# Patient Record
Sex: Male | Born: 1945
Health system: Southern US, Community
[De-identification: ages and names within clinical notes are randomized; demographics above are authoritative.]

## PROBLEM LIST (undated history)

## (undated) DIAGNOSIS — I10 Essential (primary) hypertension: Secondary | ICD-10-CM

## (undated) DIAGNOSIS — I480 Paroxysmal atrial fibrillation: Secondary | ICD-10-CM

## (undated) HISTORY — DX: Paroxysmal atrial fibrillation: I48.0

## (undated) HISTORY — DX: Essential (primary) hypertension: I10

---

## 1998-04-11 ENCOUNTER — Emergency Department (HOSPITAL_COMMUNITY): Admission: EM | Admit: 1998-04-11 | Discharge: 1998-04-11 | Payer: Self-pay | Admitting: Emergency Medicine

## 2004-08-02 ENCOUNTER — Ambulatory Visit (HOSPITAL_COMMUNITY): Admission: RE | Admit: 2004-08-02 | Discharge: 2004-08-02 | Payer: Self-pay | Admitting: Neurology

## 2004-09-11 ENCOUNTER — Ambulatory Visit: Payer: Self-pay | Admitting: *Deleted

## 2004-09-21 ENCOUNTER — Ambulatory Visit: Payer: Self-pay | Admitting: *Deleted

## 2004-09-21 ENCOUNTER — Ambulatory Visit: Payer: Self-pay

## 2004-09-21 ENCOUNTER — Ambulatory Visit: Payer: Self-pay | Admitting: Cardiology

## 2004-09-25 ENCOUNTER — Ambulatory Visit: Payer: Self-pay | Admitting: Cardiology

## 2004-10-02 ENCOUNTER — Ambulatory Visit: Payer: Self-pay | Admitting: Internal Medicine

## 2004-10-10 ENCOUNTER — Ambulatory Visit: Payer: Self-pay | Admitting: Cardiology

## 2004-10-10 ENCOUNTER — Ambulatory Visit: Payer: Self-pay | Admitting: *Deleted

## 2004-10-17 ENCOUNTER — Ambulatory Visit: Payer: Self-pay | Admitting: Cardiology

## 2004-10-24 ENCOUNTER — Ambulatory Visit: Payer: Self-pay | Admitting: Cardiology

## 2004-10-29 ENCOUNTER — Ambulatory Visit: Payer: Self-pay | Admitting: *Deleted

## 2004-11-01 ENCOUNTER — Ambulatory Visit: Payer: Self-pay | Admitting: Cardiology

## 2004-11-23 ENCOUNTER — Ambulatory Visit: Payer: Self-pay | Admitting: Cardiology

## 2004-11-23 ENCOUNTER — Ambulatory Visit: Payer: Self-pay | Admitting: Internal Medicine

## 2004-11-30 ENCOUNTER — Ambulatory Visit: Payer: Self-pay | Admitting: Cardiology

## 2004-12-14 ENCOUNTER — Ambulatory Visit: Payer: Self-pay | Admitting: Cardiology

## 2004-12-20 ENCOUNTER — Ambulatory Visit: Payer: Self-pay | Admitting: Internal Medicine

## 2004-12-31 ENCOUNTER — Ambulatory Visit: Payer: Self-pay | Admitting: Internal Medicine

## 2005-01-04 ENCOUNTER — Ambulatory Visit: Payer: Self-pay | Admitting: Internal Medicine

## 2005-01-22 ENCOUNTER — Ambulatory Visit: Payer: Self-pay | Admitting: *Deleted

## 2005-01-23 ENCOUNTER — Ambulatory Visit: Payer: Self-pay

## 2005-01-29 ENCOUNTER — Ambulatory Visit: Payer: Self-pay

## 2005-01-29 ENCOUNTER — Ambulatory Visit: Payer: Self-pay | Admitting: Internal Medicine

## 2005-02-21 ENCOUNTER — Ambulatory Visit: Payer: Self-pay | Admitting: Cardiology

## 2005-03-11 ENCOUNTER — Ambulatory Visit: Payer: Self-pay | Admitting: Internal Medicine

## 2005-03-22 ENCOUNTER — Ambulatory Visit: Payer: Self-pay | Admitting: Internal Medicine

## 2005-03-25 ENCOUNTER — Ambulatory Visit: Payer: Self-pay | Admitting: Internal Medicine

## 2005-03-25 ENCOUNTER — Ambulatory Visit: Payer: Self-pay | Admitting: Cardiology

## 2005-04-08 ENCOUNTER — Ambulatory Visit: Payer: Self-pay | Admitting: Cardiology

## 2005-04-17 ENCOUNTER — Ambulatory Visit: Payer: Self-pay | Admitting: Internal Medicine

## 2005-05-02 ENCOUNTER — Ambulatory Visit: Payer: Self-pay | Admitting: Cardiology

## 2005-05-23 ENCOUNTER — Ambulatory Visit: Payer: Self-pay | Admitting: Cardiology

## 2005-05-27 ENCOUNTER — Ambulatory Visit: Payer: Self-pay | Admitting: Internal Medicine

## 2005-06-03 ENCOUNTER — Ambulatory Visit: Payer: Self-pay | Admitting: Internal Medicine

## 2005-06-17 ENCOUNTER — Ambulatory Visit: Payer: Self-pay | Admitting: Internal Medicine

## 2005-07-03 ENCOUNTER — Ambulatory Visit: Payer: Self-pay | Admitting: Internal Medicine

## 2005-07-08 ENCOUNTER — Ambulatory Visit: Payer: Self-pay | Admitting: Internal Medicine

## 2005-08-05 ENCOUNTER — Ambulatory Visit: Payer: Self-pay | Admitting: Internal Medicine

## 2005-08-08 ENCOUNTER — Ambulatory Visit: Payer: Self-pay | Admitting: Internal Medicine

## 2005-09-02 ENCOUNTER — Ambulatory Visit: Payer: Self-pay | Admitting: Internal Medicine

## 2005-09-13 ENCOUNTER — Ambulatory Visit: Payer: Self-pay | Admitting: Internal Medicine

## 2005-09-23 ENCOUNTER — Ambulatory Visit: Payer: Self-pay | Admitting: Internal Medicine

## 2005-10-25 ENCOUNTER — Ambulatory Visit: Payer: Self-pay | Admitting: Internal Medicine

## 2005-11-07 ENCOUNTER — Ambulatory Visit: Payer: Self-pay | Admitting: Internal Medicine

## 2005-11-21 ENCOUNTER — Ambulatory Visit: Payer: Self-pay | Admitting: Cardiology

## 2005-11-29 ENCOUNTER — Ambulatory Visit: Payer: Self-pay | Admitting: Internal Medicine

## 2005-12-06 ENCOUNTER — Ambulatory Visit: Payer: Self-pay | Admitting: Cardiology

## 2006-01-03 ENCOUNTER — Ambulatory Visit: Payer: Self-pay | Admitting: Cardiovascular Disease

## 2006-01-14 ENCOUNTER — Ambulatory Visit: Payer: Self-pay | Admitting: Cardiovascular Disease

## 2006-01-31 ENCOUNTER — Ambulatory Visit: Payer: Self-pay | Admitting: Internal Medicine

## 2006-03-11 ENCOUNTER — Ambulatory Visit: Payer: Self-pay | Admitting: Cardiology

## 2006-03-25 ENCOUNTER — Ambulatory Visit: Payer: Self-pay | Admitting: *Deleted

## 2006-04-15 ENCOUNTER — Ambulatory Visit: Payer: Self-pay | Admitting: Cardiology

## 2006-05-09 ENCOUNTER — Ambulatory Visit: Payer: Self-pay | Admitting: Cardiology

## 2006-05-30 ENCOUNTER — Ambulatory Visit: Payer: Self-pay | Admitting: Cardiovascular Disease

## 2006-06-06 ENCOUNTER — Ambulatory Visit: Payer: Self-pay | Admitting: Internal Medicine

## 2006-06-11 ENCOUNTER — Ambulatory Visit: Payer: Self-pay | Admitting: Internal Medicine

## 2006-06-18 ENCOUNTER — Ambulatory Visit: Payer: Self-pay | Admitting: Internal Medicine

## 2006-06-20 ENCOUNTER — Encounter: Admission: RE | Admit: 2006-06-20 | Discharge: 2006-06-20 | Payer: Self-pay | Admitting: Internal Medicine

## 2006-06-20 ENCOUNTER — Ambulatory Visit: Payer: Self-pay | Admitting: *Deleted

## 2006-07-14 ENCOUNTER — Ambulatory Visit: Payer: Self-pay | Admitting: Cardiovascular Disease

## 2006-07-29 ENCOUNTER — Ambulatory Visit: Payer: Self-pay | Admitting: Cardiology

## 2006-08-08 ENCOUNTER — Ambulatory Visit: Payer: Self-pay | Admitting: Internal Medicine

## 2006-08-12 ENCOUNTER — Ambulatory Visit: Payer: Self-pay | Admitting: *Deleted

## 2006-08-26 ENCOUNTER — Ambulatory Visit: Payer: Self-pay | Admitting: *Deleted

## 2006-09-17 ENCOUNTER — Ambulatory Visit: Payer: Self-pay | Admitting: *Deleted

## 2006-09-17 ENCOUNTER — Ambulatory Visit: Payer: Self-pay | Admitting: Internal Medicine

## 2006-12-16 ENCOUNTER — Ambulatory Visit: Payer: Self-pay | Admitting: Cardiology

## 2006-12-30 ENCOUNTER — Ambulatory Visit: Payer: Self-pay | Admitting: Cardiology

## 2007-01-14 ENCOUNTER — Ambulatory Visit: Payer: Self-pay | Admitting: Internal Medicine

## 2007-03-06 ENCOUNTER — Ambulatory Visit: Payer: Self-pay | Admitting: Cardiovascular Disease

## 2007-03-25 ENCOUNTER — Ambulatory Visit: Payer: Self-pay | Admitting: Cardiology

## 2007-04-14 ENCOUNTER — Ambulatory Visit: Payer: Self-pay | Admitting: Cardiology

## 2007-05-21 ENCOUNTER — Ambulatory Visit: Payer: Self-pay | Admitting: Internal Medicine

## 2007-06-11 ENCOUNTER — Ambulatory Visit: Payer: Self-pay | Admitting: Cardiology

## 2007-06-23 ENCOUNTER — Ambulatory Visit: Payer: Self-pay | Admitting: Cardiovascular Disease

## 2007-07-14 ENCOUNTER — Ambulatory Visit: Payer: Self-pay | Admitting: Internal Medicine

## 2007-07-22 ENCOUNTER — Encounter: Admission: RE | Admit: 2007-07-22 | Discharge: 2007-07-22 | Payer: Self-pay | Admitting: Internal Medicine

## 2007-07-28 ENCOUNTER — Ambulatory Visit: Payer: Self-pay | Admitting: Cardiology

## 2007-08-06 ENCOUNTER — Ambulatory Visit: Payer: Self-pay | Admitting: Internal Medicine

## 2007-08-27 ENCOUNTER — Ambulatory Visit: Payer: Self-pay | Admitting: Cardiology

## 2007-08-27 LAB — CONVERTED CEMR LAB
Basophils Absolute: 0 10*3/uL (ref 0.0–0.1)
Eosinophils Absolute: 0.1 10*3/uL (ref 0.0–0.7)
HCT: 42.6 % (ref 39.0–52.0)
Hemoglobin: 14.4 g/dL (ref 13.0–17.0)
INR: 1.7 — ABNORMAL HIGH (ref 0.8–1.0)
Lymphocytes Relative: 22.3 % (ref 12.0–46.0)
Monocytes Absolute: 0.6 10*3/uL (ref 0.1–1.0)
Monocytes Relative: 8.5 % (ref 3.0–12.0)
Neutro Abs: 4.4 10*3/uL (ref 1.4–7.7)
Neutrophils Relative %: 66.9 % (ref 43.0–77.0)
RBC: 4.06 M/uL — ABNORMAL LOW (ref 4.22–5.81)
WBC: 6.5 10*3/uL (ref 4.5–10.5)

## 2007-09-16 ENCOUNTER — Ambulatory Visit: Payer: Self-pay | Admitting: Internal Medicine

## 2007-09-24 ENCOUNTER — Ambulatory Visit: Payer: Self-pay | Admitting: Internal Medicine

## 2007-12-18 ENCOUNTER — Ambulatory Visit: Payer: Self-pay | Admitting: Cardiology

## 2008-01-05 ENCOUNTER — Ambulatory Visit: Payer: Self-pay | Admitting: Cardiology

## 2008-01-11 ENCOUNTER — Ambulatory Visit: Payer: Self-pay | Admitting: Cardiology

## 2008-03-08 ENCOUNTER — Ambulatory Visit: Payer: Self-pay | Admitting: Cardiovascular Disease

## 2008-04-04 ENCOUNTER — Ambulatory Visit: Payer: Self-pay | Admitting: Internal Medicine

## 2008-04-20 ENCOUNTER — Ambulatory Visit: Payer: Self-pay | Admitting: Internal Medicine

## 2008-05-03 ENCOUNTER — Ambulatory Visit: Payer: Self-pay | Admitting: Internal Medicine

## 2008-05-20 ENCOUNTER — Ambulatory Visit: Payer: Self-pay | Admitting: Internal Medicine

## 2008-05-24 ENCOUNTER — Ambulatory Visit: Payer: Self-pay | Admitting: Cardiovascular Disease

## 2008-05-24 ENCOUNTER — Ambulatory Visit: Payer: Self-pay | Admitting: Internal Medicine

## 2008-05-27 IMAGING — CT CT CHEST W/ CM
2 of 3 series · 15 of 36 positions shown, 18 images · IV contrast (75CC OMNI 300)
Comparison: Abdomen and pelvis CT 06/10/06 from [HOSPITAL].

CLINICAL DATA: Right lower lobe pulmonary nodule by previous abdomen and pelvis CT. 
CT CHEST WITH CONTRAST:
TECHNIQUE: Multidetector CT imaging of the chest was performed following the standard protocol during bolus administration of intravenous contrast.
Contrast:  74cc Omnipaque 300.

[Series 2: routine chest · axial · 0.73mm/px · z∈[-287,+18]mm · 12 of 73 slices shown, 15 images]
[im 6/73  mediastinal]
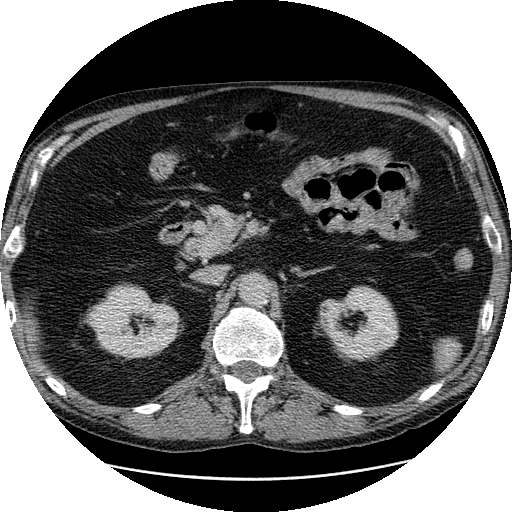
[im 6/73  lung]
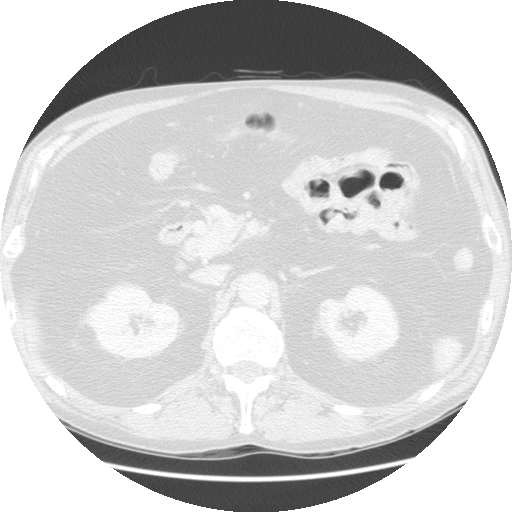
[im 11/73  lung]
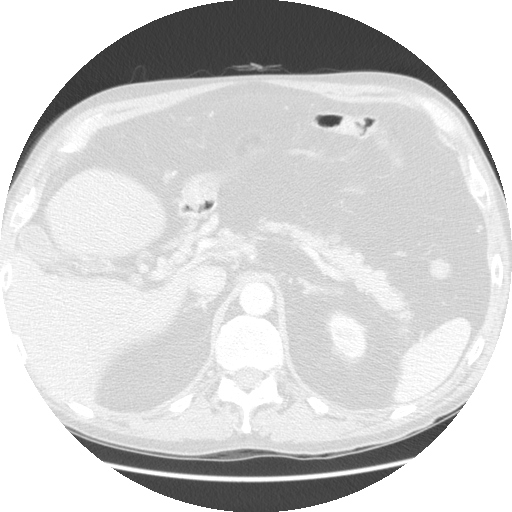
[im 17/73  lung]
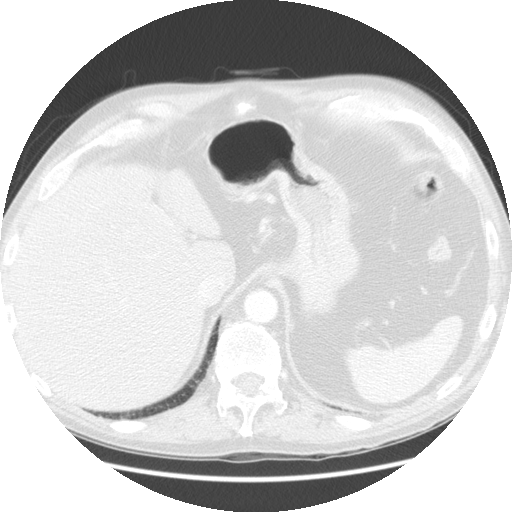
[im 22/73  lung]
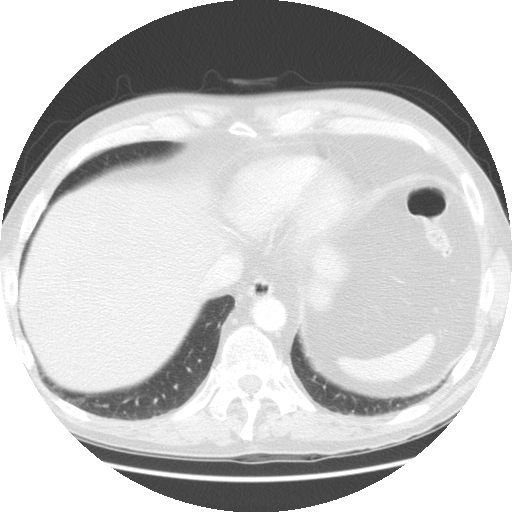
[im 27/73  mediastinal]
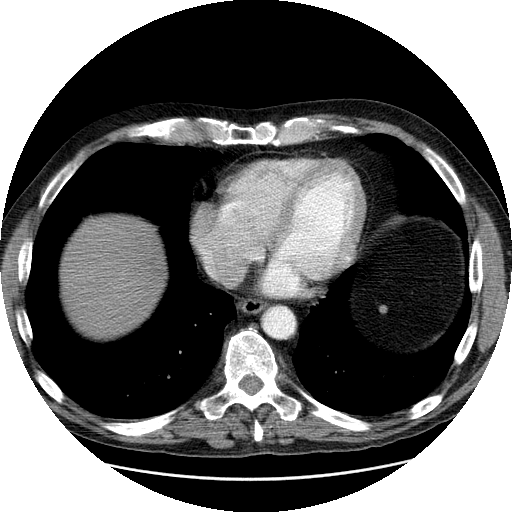
[im 27/73  lung]
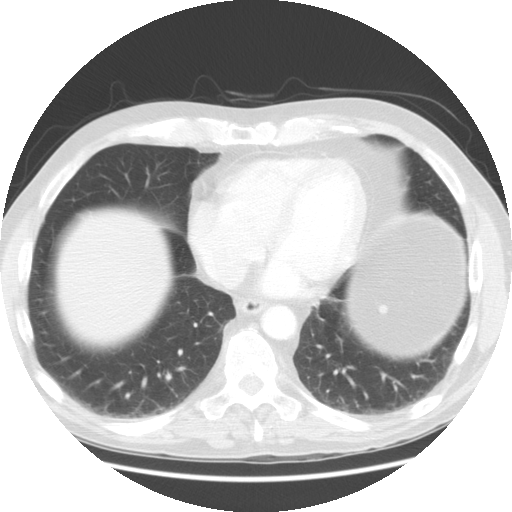
[im 33/73  lung]
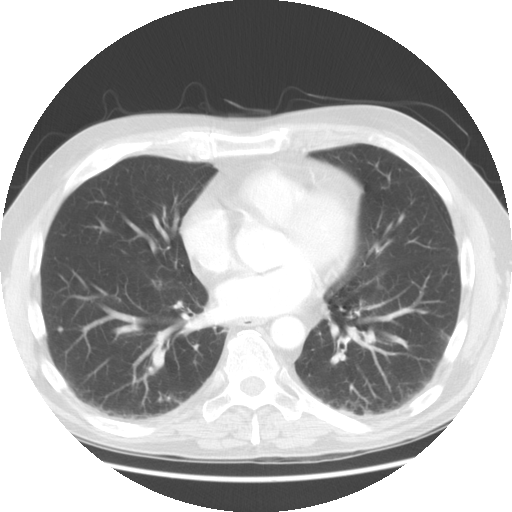
[im 41/73  lung]
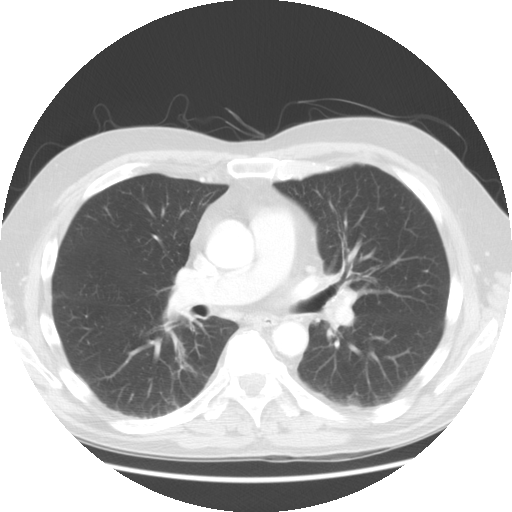
[im 46/73  lung]
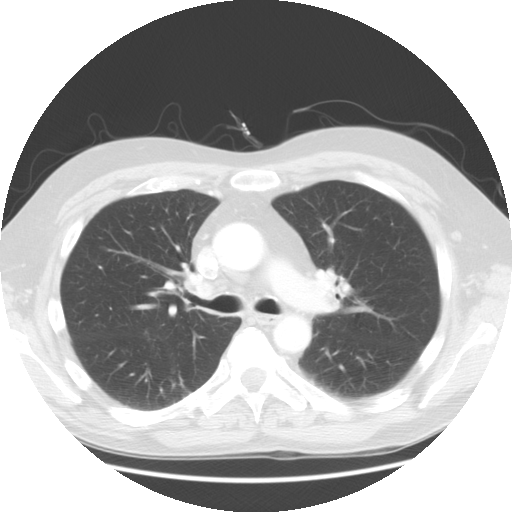
[im 51/73  mediastinal]
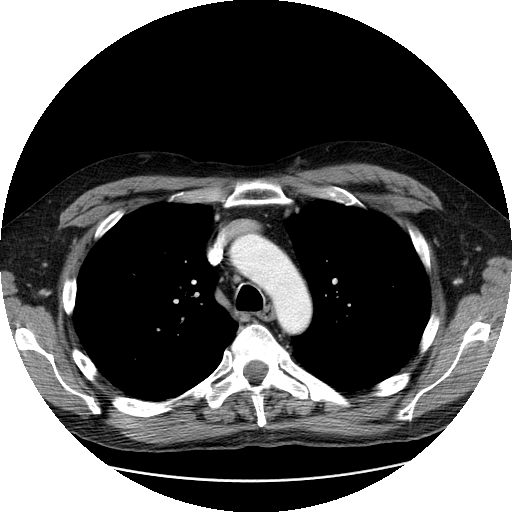
[im 51/73  lung]
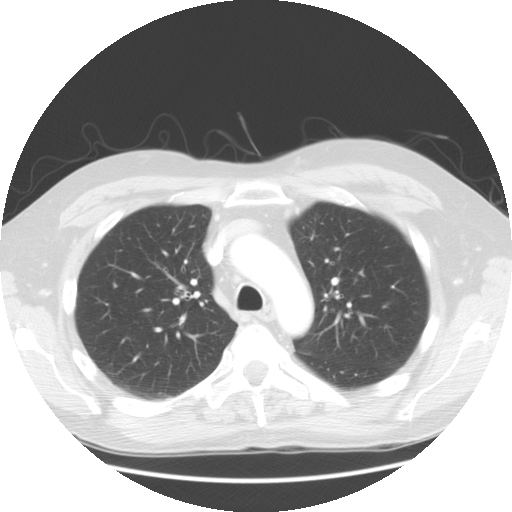
[im 57/73  lung]
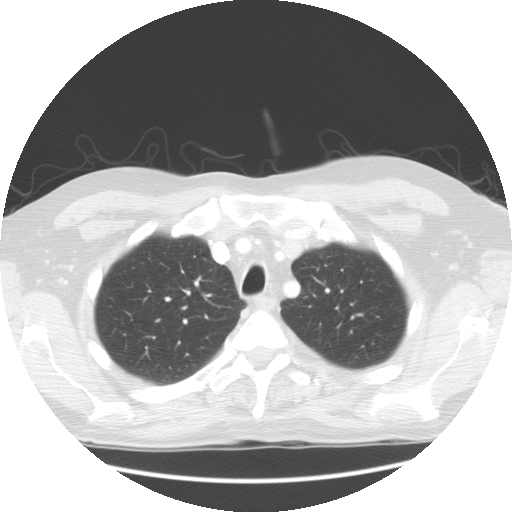
[im 62/73  lung]
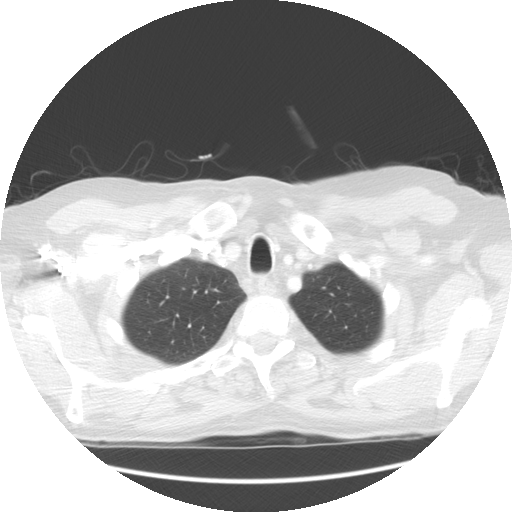
[im 67/73  lung]
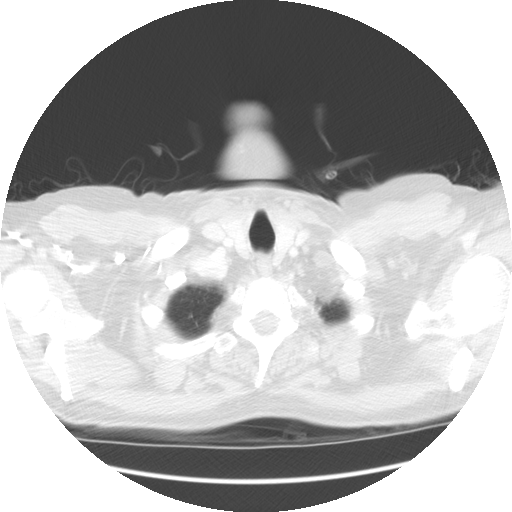

[Series 401: coronal · coronal · 0.73mm/px · 3 of 150 slices shown]
[im 30/150  lung]
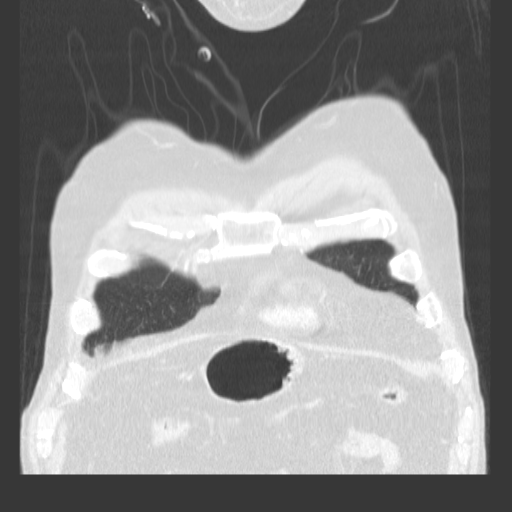
[im 60/150  lung]
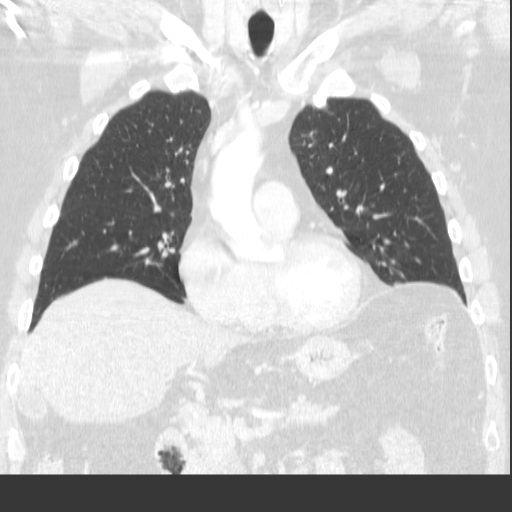
[im 90/150  lung]
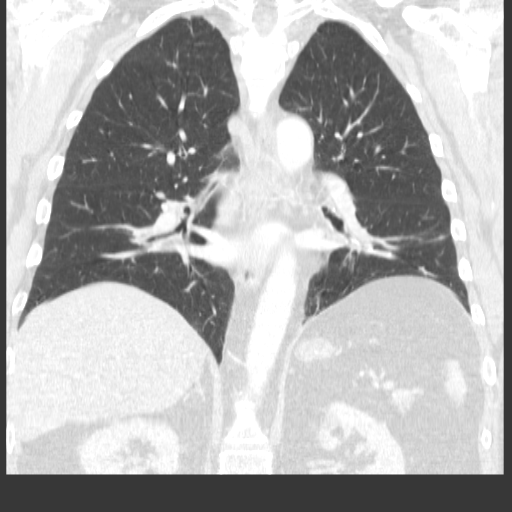

[15 of 36 positions shown; findings below may reference images not displayed]

FINDINGS: No axillary, mediastinal, or hilar adenopathy.  Normal heart size.  No pericardial or pleural effusion.  Intact thoracic aorta.  No aneurysm or dissection.  Mild atherosclerosis.  Central pulmonary arteries are patent.  Imaging of the upper abdomen demonstrates no acute finding. 
Lung windows redemonstrate a stable well circumscribed non calcified nodule in the right lower lobe measuring 4mm, image 41.  
No additional right lung nodules.  
On the left side, there is a second 4mm well circumscribed non calcified nodule in the superior segment left hepatic lobe (image 38).  No additional suspicious pulmonary nodule or mass.  Minimal subpleural scarring is evident in the left lung along the major fissure.  Atelectasis and scarring is noted in the lingula in both lower lobes.  No acute consolidation, pneumonia, interstitial change, or edema.  No pneumothorax.
IMPRESSION: 1.  Stable non calcified sub-cm bibasilar lung nodules.  Statistically, these are again most likely granulomas.  A follow up to document stability could be performed at six months. 
2.  No adenopathy or acute chest finding.

## 2008-06-03 ENCOUNTER — Ambulatory Visit: Payer: Self-pay | Admitting: Internal Medicine

## 2008-06-24 ENCOUNTER — Ambulatory Visit: Payer: Self-pay | Admitting: Internal Medicine

## 2008-07-15 ENCOUNTER — Ambulatory Visit: Payer: Self-pay | Admitting: Internal Medicine

## 2008-08-11 ENCOUNTER — Ambulatory Visit: Payer: Self-pay | Admitting: Cardiovascular Disease

## 2008-08-11 LAB — CONVERTED CEMR LAB: Prothrombin Time: 67.5 s (ref 10.9–13.3)

## 2008-08-16 ENCOUNTER — Ambulatory Visit: Payer: Self-pay | Admitting: Cardiovascular Disease

## 2008-08-30 ENCOUNTER — Ambulatory Visit: Payer: Self-pay | Admitting: Cardiology

## 2008-09-14 ENCOUNTER — Ambulatory Visit: Payer: Self-pay | Admitting: Cardiovascular Disease

## 2008-10-05 ENCOUNTER — Ambulatory Visit: Payer: Self-pay | Admitting: Internal Medicine

## 2008-10-11 ENCOUNTER — Encounter: Payer: Self-pay | Admitting: *Deleted

## 2008-11-16 ENCOUNTER — Encounter: Payer: Self-pay | Admitting: *Deleted

## 2008-12-27 ENCOUNTER — Ambulatory Visit: Payer: Self-pay | Admitting: Cardiovascular Disease

## 2009-04-13 ENCOUNTER — Encounter (INDEPENDENT_AMBULATORY_CARE_PROVIDER_SITE_OTHER): Payer: Self-pay | Admitting: Cardiology

## 2009-05-04 ENCOUNTER — Ambulatory Visit: Payer: Self-pay | Admitting: Cardiology

## 2009-05-04 LAB — CONVERTED CEMR LAB: POC INR: 1

## 2009-05-11 ENCOUNTER — Encounter (INDEPENDENT_AMBULATORY_CARE_PROVIDER_SITE_OTHER): Payer: Self-pay | Admitting: Cardiology

## 2009-05-11 ENCOUNTER — Ambulatory Visit: Payer: Self-pay | Admitting: Internal Medicine

## 2009-05-11 LAB — CONVERTED CEMR LAB: POC INR: 1.3

## 2009-05-17 ENCOUNTER — Ambulatory Visit: Payer: Self-pay | Admitting: Cardiovascular Disease

## 2009-05-31 ENCOUNTER — Ambulatory Visit: Payer: Self-pay | Admitting: Cardiovascular Disease

## 2009-05-31 LAB — CONVERTED CEMR LAB: POC INR: 2.9

## 2009-06-21 ENCOUNTER — Ambulatory Visit: Payer: Self-pay | Admitting: Cardiology

## 2009-06-21 LAB — CONVERTED CEMR LAB: POC INR: 3

## 2009-07-19 ENCOUNTER — Ambulatory Visit: Payer: Self-pay | Admitting: Cardiovascular Disease

## 2009-09-06 ENCOUNTER — Ambulatory Visit: Payer: Self-pay | Admitting: Internal Medicine

## 2009-09-27 ENCOUNTER — Ambulatory Visit: Payer: Self-pay | Admitting: Cardiology

## 2009-09-27 LAB — CONVERTED CEMR LAB: POC INR: 2.7

## 2009-10-12 DIAGNOSIS — I4891 Unspecified atrial fibrillation: Secondary | ICD-10-CM | POA: Insufficient documentation

## 2009-10-12 DIAGNOSIS — I10 Essential (primary) hypertension: Secondary | ICD-10-CM | POA: Insufficient documentation

## 2009-10-13 ENCOUNTER — Ambulatory Visit: Payer: Self-pay | Admitting: Internal Medicine

## 2009-10-13 ENCOUNTER — Ambulatory Visit: Payer: Self-pay | Admitting: Cardiovascular Disease

## 2009-10-13 LAB — CONVERTED CEMR LAB: POC INR: 3.2

## 2009-10-19 ENCOUNTER — Encounter: Payer: Self-pay | Admitting: Internal Medicine

## 2009-11-28 ENCOUNTER — Encounter: Payer: Self-pay | Admitting: Internal Medicine

## 2009-12-06 ENCOUNTER — Encounter: Payer: Self-pay | Admitting: Internal Medicine

## 2009-12-07 ENCOUNTER — Encounter: Payer: Self-pay | Admitting: Internal Medicine

## 2010-06-03 ENCOUNTER — Encounter: Payer: Self-pay | Admitting: Internal Medicine

## 2010-06-12 NOTE — Assessment & Plan Note (Signed)
Summary: per check out/sf  Medications Added FLECAINIDE ACETATE 150 MG TABS (FLECAINIDE ACETATE) Take one tablet by mouth twice daily. AMBIEN CR 12.5 MG CR-TABS (ZOLPIDEM TARTRATE) as needed PRADAXA 150 MG CAPS (DABIGATRAN ETEXILATE MESYLATE) one twice daily      Allergies Added: NKDA  Visit Type:  Follow-up Primary Provider:  Vassie Loll,  MD   History of Present Illness: Nathan Neal returns today for followup of his atrial fibrillation.  The patient is a pleasant 65 yo man with a longstanding h/o atrial fibrillation who has been controlled nicely on flecainide.  He notes that he has occaisional episodes of atrial fib but he only notes mild palpitations which was in contrast to fairly severe symptoms prior to medical therapy.  The patient denies syncope, c/p or sob.  No peripheral edema.  Current Medications (verified): 1)  Flecainide Acetate 150 Mg Tabs (Flecainide Acetate) .... Take One Tablet By Mouth Twice Daily. 2)  Metoprolol Succinate 100 Mg Xr24h-Tab (Metoprolol Succinate) .... Take One Tablet By Mouth Two Times A Day 3)  Altace 10 Mg Caps (Ramipril) .Marland Kitchen.. 1 Capsule By Mouth Daily 4)  Lipitor 10 Mg Tabs (Atorvastatin Calcium) .Marland Kitchen.. 1 Tablet By Mouth Daily 5)  Ambien Cr 12.5 Mg Cr-Tabs (Zolpidem Tartrate) .... As Needed 6)  Tramadol Hcl 50 Mg Tabs (Tramadol Hcl) .Marland Kitchen.. 1 Tablet By Mouth Daily As Needed For Pain 7)  Coumadin 5 Mg Tabs (Warfarin Sodium) .... Take As Directed By [facility]madin Clinic  Allergies (verified): No Known Drug Allergies  Past History:  Past Medical History: Last updated: 10/12/2009 Current Problems:  HYPERTENSION (ICD-401.9) COUMADIN THERAPY (ICD-V58.61) PAROXYSMAL ATRIAL FIBRILLATION (ICD-427.31)    Review of Systems  The patient denies chest pain, syncope, dyspnea on exertion, and peripheral edema.    Vital Signs:  Patient profile:   65 year old male Height:      75 inches Weight:      206 pounds BMI:     25.84 Pulse rate:   73 / minute BP sitting:    104 / 62  (left arm)  Vitals Entered By: Laurance Flatten CMA (October 13, 2009 3:52 PM)  Physical Exam  General:  Well developed, well nourished, in no acute distress.  HEENT: normal except wears glasses. Neck: supple. No JVD. Carotids 2+ bilaterally no bruits Cor: RRR no rubs, gallops or murmur Lungs: CTA with no wheezes, rales, or rhonchi Ab: soft, nontender. nondistended. No HSM. Good bowel sounds Ext: warm. no cyanosis, clubbing or edema Neuro: alert and oriented. Grossly nonfocal. affect pleasant    EKG  Procedure date:  10/13/2009  Findings:      Normal sinus rhythm with rate of:  First degree AV-Block noted.    Impression & Recommendations:  Problem # 1:  PAROXYSMAL ATRIAL FIBRILLATION (ICD-427.31) His symptoms appear to be well controlled.  I have asked him to obtain a trough flecainide level.  No other medicine changes. The following medications were removed from the medication list:    Coumadin 1 Mg Tabs (Warfarin sodium) .Marland Kitchen... Take as directed by coumadin clinic. His updated medication list for this problem includes:    Flecainide Acetate 150 Mg Tabs (Flecainide acetate) .Marland Kitchen... Take one tablet by mouth twice daily.    Metoprolol Succinate 100 Mg Xr24h-tab (Metoprolol succinate) .Marland Kitchen... Take one tablet by mouth two times a day    Coumadin 5 Mg Tabs (Warfarin sodium) .Marland Kitchen... Take as directed by coumadin clinic  Problem # 2:  HYPERTENSION (ICD-401.9) His blood pressure remains well controlled. A  low sodium diet is recommended. His updated medication list for this problem includes:    Metoprolol Succinate 100 Mg Xr24h-tab (Metoprolol succinate) .Marland Kitchen... Take one tablet by mouth two times a day    Altace 10 Mg Caps (Ramipril) .Marland Kitchen... 1 capsule by mouth daily  Problem # 3:  COUMADIN THERAPY (ICD-V58.61) We discussed the treatment options with regard to continuing coumadin vs switching to Pradaxa.  After careful discussion, he would like to try Pradaxa.  Patient Instructions: 1)   Your physician recommends that you schedule a follow-up appointment in: 6 months with Dr Ladona Ridgel 2)  Your physician has recommended you make the following change in your medication: Stop coumadin and start Pradaxa 150 mg twice a day. 3)  Your physician recommends that you return for lab work 10 days after starting Pradaxa (CBC) Prescriptions: PRADAXA 150 MG CAPS (DABIGATRAN ETEXILATE MESYLATE) one twice daily  #60 x 6   Entered by:   Dennis Bast, RN, BSN   Authorized by:   Laren Boom, MD, [facility]   Signed by:   Dennis Bast, RN, BSN on 10/13/2009   Method used:   Electronically to        [facility] (retail)       [address]       [place], [place]  [id number]       Ph: [phone] or [phone]       Fax: [phone]   RxID:   [id number]

## 2010-06-12 NOTE — Medication Information (Signed)
Summary: [facility]  Anticoagulant Therapy  Managed by: Inactive Referring MD: Lewayne Bunting PCP: Vassie Loll,  MD Supervising MD: Graciela Husbands MD, Viviann Spare Indication 1: Atrial Fibrillation (ICD-427.31) Lab Used: [facility] Site: [facility] INR RANGE 2 - 3          Comments: Pt is off Coumadin and on Pradaxa. Cloyde Reams RN  November 28, 2009 10:58 AM   Allergies: No Known Drug Allergies  Anticoagulation Management History:      Negative risk factors for bleeding include an age less than 41 years old.  The bleeding index is 'low risk'.  Positive CHADS2 values include History of HTN.  Negative CHADS2 values include Age > 50 years old.  The start date was 09/21/2004.  His last INR was 7.0 ratio.  Anticoagulation responsible provider: Graciela Husbands MD, Viviann Spare.  Exp: 12/2010.    Anticoagulation Management Assessment/Plan:      The patient's current anticoagulation dose is Coumadin 5 mg tabs: Take as directed by [facility] Clinic.  The target INR is 2 - 3.  The next INR is due 11/15/2009.  Anticoagulation instructions were given to patient.  Results were reviewed/authorized by Inactive.         Prior Anticoagulation Instructions: INR 3.2 Skip today then resume 2.5mg s daily. Recheck in 4 weeks.

## 2010-06-12 NOTE — Medication Information (Signed)
Summary: rov/ewj  Anticoagulant Therapy  Managed by: Weston Brass, PharmD Referring MD: Lewayne Bunting Supervising MD: Shirlee Latch MD, Freida Busman Indication 1: Atrial Fibrillation (ICD-427.31) Lab Used: [facility] Site: [facility] INR POC 2.7 INR RANGE 2 - 3  Dietary changes: no    Health status changes: no    Bleeding/hemorrhagic complications: no    Recent/future hospitalizations: no    Any changes in medication regimen? no    Recent/future dental: no  Any missed doses?: no       Is patient compliant with meds? yes       Allergies: No Known Drug Allergies  Anticoagulation Management History:      The patient is taking warfarin and comes in today for a routine follow up visit.  Negative risk factors for bleeding include an age less than 45 years old.  The bleeding index is 'low risk'.  Negative CHADS2 values include Age > 25 years old.  The start date was 09/21/2004.  His last INR was 7.0 ratio.  Anticoagulation responsible provider: Shirlee Latch MD, Bekim Werntz.  INR POC: 2.7.  Cuvette Lot#: [id number].  Exp: 12/2010.    Anticoagulation Management Assessment/Plan:      The patient's current anticoagulation dose is Coumadin 5 mg tabs: Take as directed by [facility] Clinic, Coumadin 1 mg tabs: Take as directed by [facility]madin Clinic..  The target INR is 2 - 3.  The next INR is due 10/13/2009.  Anticoagulation instructions were given to patient.  Results were reviewed/authorized by Weston Brass, PharmD.  He was notified by Weston Brass PharmD.         Prior Anticoagulation Instructions: INR 4.0  Skip today's dosage of coumadin, then 1mg  tomorrow then resume same dosage 2.5mg  daily.  Recheck in 3 weeks.    Current Anticoagulation Instructions: INR 2.7  Continue same dose of 1/2 tablet every day

## 2010-06-12 NOTE — Letter (Signed)
Summary: [facility] Annual Physical Exam Notes   [facility] Annual Physical Exam Notes   Imported By: Roderic Ovens 12/22/2009 14:45:29  _____________________________________________________________________  External Attachment:    Type:   Image     Comment:   External Document

## 2010-06-12 NOTE — Medication Information (Signed)
Summary: rov.mp  Anticoagulant Therapy  Managed by: Cloyde Reams, RN, BSN Referring MD: Lewayne Bunting Supervising MD: Clifton James MD, Cristal Deer Indication 1: Atrial Fibrillation (ICD-427.31) Lab Used: [facility] Site: [facility] INR POC 2.6 INR RANGE 2 - 3  Dietary changes: no    Health status changes: no    Bleeding/hemorrhagic complications: no    Recent/future hospitalizations: no    Any changes in medication regimen? no    Recent/future dental: no  Any missed doses?: no       Is patient compliant with meds? yes       Allergies (verified): No Known Drug Allergies  Anticoagulation Management History:      The patient is taking warfarin and comes in today for a routine follow up visit.  Negative risk factors for bleeding include an age less than 17 years old.  The bleeding index is 'low risk'.  Negative CHADS2 values include Age > 105 years old.  The start date was 09/21/2004.  His last INR was 7.0 ratio.  Anticoagulation responsible provider: Clifton James MD, Cristal Deer.  INR POC: 2.6.  Cuvette Lot#: [id number].  Exp: 06/2010.    Anticoagulation Management Assessment/Plan:      The patient's current anticoagulation dose is Coumadin 5 mg tabs: Take as directed by [facility] Clinic, Coumadin 1 mg tabs: Take as directed by [facility] Clinic..  The target INR is 2 - 3.  The next INR is due 05/31/2009.  Anticoagulation instructions were given to patient.  Results were reviewed/authorized by Cloyde Reams, RN, BSN.  He was notified by Cloyde Reams RN.         Prior Anticoagulation Instructions: INR 1.3 Take 5 mg = 1 tab daily for 3 days, then resume 2.5 mg = 0.5 tab daily.  Recheck in 1 week.   Current Anticoagulation Instructions: INR 2.6  Continue on same dosage 2.5mg  daily.  Recheck in 2 weeks.

## 2010-06-12 NOTE — Medication Information (Signed)
Summary: ccr  Anticoagulant Therapy  Managed by: Cloyde Reams, RN, BSN Referring MD: Lewayne Bunting Supervising MD: Gala Romney MD, Reuel Boom Indication 1: Atrial Fibrillation (ICD-427.31) Lab Used: [facility] Site: [facility] INR POC 4.1 INR RANGE 2 - 3  Dietary changes: no    Health status changes: no    Bleeding/hemorrhagic complications: no    Recent/future hospitalizations: no    Any changes in medication regimen? no    Recent/future dental: no  Any missed doses?: no       Is patient compliant with meds? yes       Allergies (verified): No Known Drug Allergies  Anticoagulation Management History:      The patient is taking warfarin and comes in today for a routine follow up visit.  Negative risk factors for bleeding include an age less than 24 years old.  The bleeding index is 'low risk'.  Negative CHADS2 values include Age > 23 years old.  The start date was 09/21/2004.  His last INR was 7.0 ratio.  Anticoagulation responsible provider: Bensimhon MD, Reuel Boom.  INR POC: 4.1.  Cuvette Lot#: [id number].  Exp: 10/2010.    Anticoagulation Management Assessment/Plan:      The patient's current anticoagulation dose is Coumadin 5 mg tabs: Take as directed by [facility]in Clinic, Coumadin 1 mg tabs: Take as directed by [facility]madin Clinic..  The target INR is 2 - 3.  The next INR is due 09/27/2009.  Anticoagulation instructions were given to patient.  Results were reviewed/authorized by Cloyde Reams, RN, BSN.  He was notified by Cloyde Reams RN.         Prior Anticoagulation Instructions: INR 3.0 Continue 2.5mg s daily. Recheck in 4 weeks.   Current Anticoagulation Instructions: INR 4.0  Skip today's dosage of coumadin, then 1mg  tomorrow then resume same dosage 2.5mg  daily.  Recheck in 3 weeks.

## 2010-06-12 NOTE — Medication Information (Signed)
Summary: Nathan Neal  Anticoagulant Therapy  Managed by: Bethena Midget, RN, BSN Referring MD: Lewayne Bunting Supervising MD: Eden Emms MD, Theron Arista Indication 1: Atrial Fibrillation (ICD-427.31) Lab Used: [facility] Site: [facility] INR POC 3.0 INR RANGE 2 - 3  Dietary changes: no    Health status changes: no    Bleeding/hemorrhagic complications: no    Recent/future hospitalizations: no    Any changes in medication regimen? no    Recent/future dental: no  Any missed doses?: no       Is patient compliant with meds? yes       Allergies: No Known Drug Allergies  Anticoagulation Management History:      The patient is taking warfarin and comes in today for a routine follow up visit.  Negative risk factors for bleeding include an age less than 1 years old.  The bleeding index is 'low risk'.  Negative CHADS2 values include Age > 35 years old.  The start date was 09/21/2004.  His last INR was 7.0 ratio.  Anticoagulation responsible provider: Eden Emms MD, Theron Arista.  INR POC: 3.0.  Cuvette Lot#: [id number].  Exp: 09/2010.    Anticoagulation Management Assessment/Plan:      The patient's current anticoagulation dose is Coumadin 5 mg tabs: Take as directed by [facility]in Clinic, Coumadin 1 mg tabs: Take as directed by [facility]in Clinic..  The target INR is 2 - 3.  The next INR is due 08/16/2009.  Anticoagulation instructions were given to patient.  Results were reviewed/authorized by Bethena Midget, RN, BSN.  He was notified by Bethena Midget, RN, BSN.         Prior Anticoagulation Instructions: INR 3.0  Continue on same dosage 1/2 tablet daily.  Recheck in 4 weeks.    Current Anticoagulation Instructions: INR 3.0 Continue 2.5mg s daily. Recheck in 4 weeks.

## 2010-06-12 NOTE — Medication Information (Signed)
Summary: rov/ewj  Anticoagulant Therapy  Managed by: Shelby Dubin, PharmD Referring MD: Lewayne Bunting Supervising MD: Eden Emms MD, Theron Arista Indication 1: Atrial Fibrillation (ICD-427.31) Lab Used: [facility] Site: [facility] INR POC 2.9 INR RANGE 2 - 3  Dietary changes: no    Health status changes: no    Bleeding/hemorrhagic complications: no    Recent/future hospitalizations: no    Any changes in medication regimen? no    Recent/future dental: no  Any missed doses?: no       Is patient compliant with meds? yes       Allergies: No Known Drug Allergies  Anticoagulation Management History:      The patient is taking warfarin and comes in today for a routine follow up visit.  Negative risk factors for bleeding include an age less than 50 years old.  The bleeding index is 'low risk'.  Negative CHADS2 values include Age > 58 years old.  The start date was 09/21/2004.  His last INR was 7.0 ratio.  Anticoagulation responsible provider: Eden Emms MD, Theron Arista.  INR POC: 2.9.  Cuvette Lot#: [id number].  Exp: 08/2010.    Anticoagulation Management Assessment/Plan:      The patient's current anticoagulation dose is Coumadin 5 mg tabs: Take as directed by [facility]madin Clinic, Coumadin 1 mg tabs: Take as directed by [facility] Clinic..  The target INR is 2 - 3.  The next INR is due 06/21/2009.  Anticoagulation instructions were given to patient.  Results were reviewed/authorized by Shelby Dubin, PharmD.  He was notified by Lew Dawes, PharmD Candidate.         Prior Anticoagulation Instructions: INR 2.6  Continue on same dosage 2.5mg  daily.  Recheck in 2 weeks.    Current Anticoagulation Instructions: INR 2.9  Continue same dose of 0.5 tablet daily. Recheck in 3 weeks.

## 2010-06-12 NOTE — Medication Information (Signed)
Summary: rov/eh  Anticoagulant Therapy  Managed by: Cloyde Reams, RN, BSN Referring MD: Lewayne Bunting Supervising MD: Jens Som MD, Arlys John Indication 1: Atrial Fibrillation (ICD-427.31) Lab Used: [facility] Site: [facility] INR POC 3.0 INR RANGE 2 - 3  Dietary changes: no    Health status changes: no    Bleeding/hemorrhagic complications: no    Recent/future hospitalizations: no    Any changes in medication regimen? no    Recent/future dental: no  Any missed doses?: no       Is patient compliant with meds? yes       Allergies (verified): No Known Drug Allergies  Anticoagulation Management History:      The patient is taking warfarin and comes in today for a routine follow up visit.  Negative risk factors for bleeding include an age less than 3 years old.  The bleeding index is 'low risk'.  Negative CHADS2 values include Age > 37 years old.  The start date was 09/21/2004.  His last INR was 7.0 ratio.  Anticoagulation responsible provider: Jens Som MD, Arlys John.  INR POC: 3.0.  Cuvette Lot#: [id number].  Exp: 08/2010.    Anticoagulation Management Assessment/Plan:      The patient's current anticoagulation dose is Coumadin 5 mg tabs: Take as directed by [facility]madin Clinic, Coumadin 1 mg tabs: Take as directed by [facility]madin Clinic..  The target INR is 2 - 3.  The next INR is due 07/19/2009.  Anticoagulation instructions were given to patient.  Results were reviewed/authorized by Cloyde Reams, RN, BSN.  He was notified by Cloyde Reams RN.         Prior Anticoagulation Instructions: INR 2.9  Continue same dose of 0.5 tablet daily. Recheck in 3 weeks.     Current Anticoagulation Instructions: INR 3.0  Continue on same dosage 1/2 tablet daily.  Recheck in 4 weeks.

## 2010-06-12 NOTE — Medication Information (Signed)
Summary: rov/sp  Anticoagulant Therapy  Managed by: Bethena Midget, RN, BSN Referring MD: Lewayne Bunting Supervising MD: Excell Seltzer MD, Casimiro Needle Indication 1: Atrial Fibrillation (ICD-427.31) Lab Used: [facility] Site: [facility] INR POC 3.2 INR RANGE 2 - 3  Dietary changes: no    Health status changes: no    Bleeding/hemorrhagic complications: no    Recent/future hospitalizations: no    Any changes in medication regimen? no    Recent/future dental: no  Any missed doses?: no       Is patient compliant with meds? yes      Comments: Seeing Dr Ladona Ridgel today.   Allergies: No Known Drug Allergies  Anticoagulation Management History:      The patient is taking warfarin and comes in today for a routine follow up visit.  Negative risk factors for bleeding include an age less than 51 years old.  The bleeding index is 'low risk'.  Positive CHADS2 values include History of HTN.  Negative CHADS2 values include Age > 54 years old.  The start date was 09/21/2004.  His last INR was 7.0 ratio.  Anticoagulation responsible provider: Excell Seltzer MD, Casimiro Needle.  INR POC: 3.2.  Cuvette Lot#: [id number].  Exp: 12/2010.    Anticoagulation Management Assessment/Plan:      The patient's current anticoagulation dose is Coumadin 5 mg tabs: Take as directed by [facility] Clinic, Coumadin 1 mg tabs: Take as directed by [facility] Clinic..  The target INR is 2 - 3.  The next INR is due 11/15/2009.  Anticoagulation instructions were given to patient.  Results were reviewed/authorized by Bethena Midget, RN, BSN.  He was notified by Bethena Midget, RN, BSN.         Prior Anticoagulation Instructions: INR 2.7  Continue same dose of 1/2 tablet every day  Current Anticoagulation Instructions: INR 3.2 Skip today then resume 2.5mg s daily. Recheck in 4 weeks.

## 2010-08-07 ENCOUNTER — Other Ambulatory Visit: Payer: Self-pay | Admitting: *Deleted

## 2010-08-07 DIAGNOSIS — I4891 Unspecified atrial fibrillation: Secondary | ICD-10-CM

## 2010-08-07 MED ORDER — METOPROLOL SUCCINATE ER 100 MG PO TB24: 100.0000 mg | ORAL_TABLET | Freq: Two times a day (BID) | ORAL | Status: DC

## 2010-09-25 NOTE — Assessment & Plan Note (Signed)
[facility]                         ELECTROPHYSIOLOGY OFFICE NOTE   Nathan Neal, RIVIELLO                   MRN:          [id number]  DATE:05/20/2008                            DOB:          05/04/1946    Nathan Neal returns today for followup.  He is a 65 year old male with  a history of paroxysmal atrial fibrillation and hypertension, who has  been very nicely controlled on flecainide for the last several years.  He has ultimately required 150 mg twice daily, but with this he has had  a very few symptoms of palpitations or other symptoms.  He did have some  severe fatigue, which at least partly related to a low testosterone  levels.  He is status post orchiectomy in the past secondary to a  testicular cancer.  He is improved from the energy level perspective on  testosterone injections.  No other specific complaints were made today.   MEDICATIONS:  1. Lipitor 10 mg a day.  2. Coumadin as directed.  3. Flecainide 150 twice.  4. Toprol-XL 100 mg twice daily.  5. Lunesta 3 mg nightly p.r.n.  6. Ramipril 10 mg daily.   PHYSICAL EXAMINATION:  GENERAL:  He is a pleasant, well-appearing,  middle-aged man in no distress.  VITAL SIGNS:  Blood pressure today was 130/78, the pulse 56 and regular,  respirations are 18, and the weight is 219 pounds. NECK:  No jugular  venous distention.  LUNGS:  Clear bilaterally to auscultation.  No wheezes, rales, or  rhonchi are present.  No increased work of breathing.  CARDIOVASCULAR:  Regular rate and rhythm.  Normal S1 and S2.  No  murmurs, rubs, or gallops.  ABDOMEN:  Soft and nontender.  EXTREMITIES:  No edema.   EKG demonstrates sinus bradycardia with first-degree AV block.  QRS  duration was 98 msec.   IMPRESSION:  1. Paroxysmal atrial fibrillation.  2. Chronic Coumadin therapy.  3. Hypertension.  4. Chronic flecainide therapy with nice control of his atrial      fibrillation.   DISCUSSION:  Mr.  Neal is stable.  His AFib is well controlled.  I  have asked that he obtain a trough flecainide level when he comes back  next week and to have his Coumadin levels  checked.  We will plan for now to continue his dose of flecainide as  this though we want to make  sure his flecainide level was not too high for this.  We will see the  patient back in 9 or 10 months or sooner if his AFib becomes more  problematic.     Doylene Canning. Ladona Ridgel, MD  Electronically Signed    GWT/MedQ  DD: 05/20/2008  DT: 05/21/2008  Job #: [id number]

## 2010-09-25 NOTE — Assessment & Plan Note (Signed)
[facility]                         ELECTROPHYSIOLOGY OFFICE NOTE   LEARY, MCNULTY                   MRN:          [id number]  DATE:09/16/2007                            DOB:          1945-06-07    [name] returns today for follow-up.  He is a very pleasant 65-year-  old male with a history of paroxysmal atrial fibrillation and  hypertension who returns today for follow-up.  He notes that his atrial  fibrillation has been fairly well-controlled.  He complains of fatigue  and weakness, particularly with exertion.  He states his exercise  capacity has diminished over the last couple of years.  Unfortunately,  he is no longer exercising on a regular basis.  He does walk, however,  some.   His current medications include:  1. Lipitor 10 a day.  2. Coumadin as directed.  3. Flecainide 150 twice a day.  4. Toprol 100 mg twice daily.  5. Lunesta 3 mg every night.  6. Tramadol 1 tablet b.i.d.  7. Ramipril daily; do not have the dose.   On physical exam, he is a pleasant well-appearing middle-aged man in no  distress.  Blood pressure was 111/64, the pulse 60 and regular,  respirations were 18.  Weight was 215 pounds.  NECK:  Revealed no jugular venous distention.  LUNGS:  Clear bilaterally to auscultation.  No wheezes, rales or rhonchi  are present.  CARDIOVASCULAR EXAM:  Revealed a regular rate and rhythm.  Normal S1 and  S2.  EXTREMITIES:  Demonstrated no edema.   His EKG demonstrates sinus rhythm with first-degree AV block.   IMPRESSION:  1. Paroxysmal atrial fibrillation.  2. Symptomatic bradycardia.  3. Fatigue and weakness.   DISCUSSION:  Mr. Carswell is stable.  I am concerned his beta blockers  are increasing his fatigue and weakness, perhaps along with his  flecainide.  However, he is maintaining sinus rhythm very nicely, and  for this reason, I have recommend that we continue both medications.  At  some point, we may  consider trying to decrease his beta blockers to see  if his energy level will improve.  He will Coumadin for now, and we will  see him back in the office in approximately six months.    Doylene Canning. Ladona Ridgel, MD  Electronically Signed   GWT/MedQ  DD: 09/16/2007  DT: 09/16/2007  Job #: [id number]

## 2010-09-28 NOTE — Assessment & Plan Note (Signed)
[facility]                         ELECTROPHYSIOLOGY OFFICE NOTE   SILVIANO, NEUSER                   MRN:          [id number]  DATE:06/11/2006                            DOB:          1945-12-31    SUBJECTIVE:  Mr. Coleson returns today for a follow-up visit.  He is a  very pleasant middle-aged male with a history of atrial fibrillation and  hypertension, who has been on chronic Flecainide therapy over the last  year.  He returns today for followup.  He has had very little in the way  of symptomatic palpitations since we saw him last in the office.  His  main complaint today is that he had episodic periods where he feels  dizzy and lightheaded, though he has not had frank syncope.  The patient  notes that he feels like his heart skips a beat occasionally.  He denies  chest pain.  He denies shortness of breath.  With strenuous activity he  does occasionally note episodes where he feels very weak and is unable  to function at an optimal level.   PHYSICAL EXAMINATION:  GENERAL:  He is a pleasant middle-aged man, who  is in no acute distress.  VITAL SIGNS:  Blood pressure 116/70, pulse 67 and regular, respirations  18, weight 217 pounds.  NECK:  No jugular venous distention.  LUNGS:  Clear bilaterally to auscultation.  No wheezes, rales or  rhonchi.  CARDIOVASCULAR:  A regular rate and rhythm with normal S1 and S2.  EXTREMITIES:  No clubbing, cyanosis or edema.  The pulses were 2+ and  symmetric.   MEDICATIONS:  1. Lotrel 10/20 mg daily.  2. Lipitor.  3. Zyrtec.  4. Coumadin.  5. Flecainide 150 mg b.i.d.  6. Toprol 100 mg b.i.d.  7. He also takes Lunesta to help sleep.   IMPRESSION:  1. Paroxysmal atrial fibrillation.  2. Chronic Coumadin therapy.  3. Hypertension.   DISCUSSION:  Overall Mr. Advani is stable but his symptoms of  intermittent fatigue and weakness, particularly with exertion and  occasional dizzy episodes are  somewhat concerning.  We talked about the possibility that watchful waiting or whether or not  we should have him wear  a 30-day monitor.  Will plan a period of watchful waiting and see the  patient back in a couple of months.  Should he have frank syncope or  other symptoms, he is instructed to call the office.     Doylene Canning. Ladona Ridgel, MD  Electronically Signed    GWT/MedQ  DD: 06/11/2006  DT: 06/11/2006  Job #: [id number]

## 2010-09-28 NOTE — Assessment & Plan Note (Signed)
[facility]                         ELECTROPHYSIOLOGY OFFICE NOTE   LATHON, ADAN                   MRN:          [id number]  DATE:08/08/2006                            DOB:          Oct 21, 1945    Nathan Neal returns today for followup.  He is a very pleasant middle-  aged man with a history of persistent atrial fibrillation who has been  maintained in sinus rhythm very nicely on a combination of beta-blockers  and flecainide.  He returns today for followup, he continues on  Coumadin.  He has had very little, if any, palpitation since I last saw  him in January.  He is back to his usual state of health and otherwise  feeling well.   EXAM:  He is a pleasant, well-appearing, middle-aged man in no distress.  The blood pressure was 114/72, the pulse was 67 and regular, the  respirations were 18, the weight was 214 pounds.  NECK:  No jugular venous distention.  LUNGS:  Clear bilaterally to auscultation.  No wheezes, rales or  rhonchi.  CARDIOVASCULAR:  A regular rate and rhythm with a normal S1 and S2.  EXTREMITIES:  Demonstrated no edema.   IMPRESSION:  1. Paroxysmal atrial fibrillation.  2. Chronic Coumadin therapy.  3. Hypertension.   DISCUSSION:  Overall, Nathan Neal is stable on his combination of  Toprol and flecainide.  Will plan to see him back in the office in 1  year, sooner if he has recurrent symptomatic palpitations and problems  with his afib.     Doylene Canning. Ladona Ridgel, MD  Electronically Signed    GWT/MedQ  DD: 08/08/2006  DT: 08/08/2006  Job #: [id number]   cc:   Larina Earthly, M.D.

## 2010-10-05 ENCOUNTER — Other Ambulatory Visit: Payer: Self-pay | Admitting: *Deleted

## 2010-10-05 MED ORDER — DABIGATRAN ETEXILATE MESYLATE 150 MG PO CAPS: 150.0000 mg | ORAL_CAPSULE | Freq: Two times a day (BID) | ORAL | Status: DC

## 2010-10-08 ENCOUNTER — Other Ambulatory Visit: Payer: Self-pay | Admitting: *Deleted

## 2010-10-08 MED ORDER — DABIGATRAN ETEXILATE MESYLATE 150 MG PO CAPS: 150.0000 mg | ORAL_CAPSULE | Freq: Two times a day (BID) | ORAL | Status: DC

## 2010-12-17 ENCOUNTER — Other Ambulatory Visit: Payer: Self-pay | Admitting: Internal Medicine

## 2011-01-22 ENCOUNTER — Other Ambulatory Visit: Payer: Self-pay | Admitting: Internal Medicine

## 2011-04-11 ENCOUNTER — Encounter: Payer: Self-pay | Admitting: Internal Medicine

## 2011-04-11 ENCOUNTER — Ambulatory Visit (INDEPENDENT_AMBULATORY_CARE_PROVIDER_SITE_OTHER): Payer: BC Managed Care – PPO | Admitting: Internal Medicine

## 2011-04-11 DIAGNOSIS — I10 Essential (primary) hypertension: Secondary | ICD-10-CM

## 2011-04-11 DIAGNOSIS — R002 Palpitations: Secondary | ICD-10-CM

## 2011-04-11 DIAGNOSIS — I4891 Unspecified atrial fibrillation: Secondary | ICD-10-CM

## 2011-04-11 MED ORDER — FLECAINIDE ACETATE 150 MG PO TABS: 150.0000 mg | ORAL_TABLET | Freq: Two times a day (BID) | ORAL | Status: DC

## 2011-04-11 NOTE — Assessment & Plan Note (Signed)
The patient's symptoms still appear to be fairly well controlled but I am concerned about recurrent atrial fibrillation that he does not appreciate. For this reason I have him undergo the wearing of a cardiac monitor. Additional recommendations we based on the burden of his atrial fibrillation.

## 2011-04-11 NOTE — Assessment & Plan Note (Signed)
His blood pressure appears to be well-controlled. He will continue his current medical therapy and maintain a low-sodium diet.

## 2011-04-11 NOTE — Patient Instructions (Signed)
Follow up will be based on the monitor results  Your physician has recommended that you wear an event monitor. Event monitors are medical devices that record the heart's electrical activity. Doctors most often [place] these monitors to diagnose arrhythmias. Arrhythmias are problems with the speed or rhythm of the heartbeat. The monitor is a small, portable device. You can wear one while you do your normal daily activities. This is usually used to diagnose what is causing palpitations/syncope (passing out).

## 2011-04-11 NOTE — Progress Notes (Signed)
HPI Nathan Neal returns today for followup. He is a pleasant 65 yo man with a h/o PAF, HTN, and dyslipidemia. The patient has had no significant change in his palpitation symptoms over the past several months. He does note increasing weakness particularly with exertion. It's unclear whether these symptoms represent increasingly frequent episodes of A. Fib or something else. He denies syncope. He has class II heart failure symptoms. He denies chest pain. No Known Allergies   Current Outpatient Prescriptions  Medication Sig Dispense Refill  . atorvastatin (LIPITOR) 10 MG tablet Take 10 mg by mouth daily.        . dabigatran (PRADAXA) 150 MG CAPS Take 150 mg by mouth 2 (two) times daily.        . flecainide (TAMBOCOR) 150 MG tablet TAKE ONE TABLET TWICE DAILY  180 tablet  3  . metoprolol (TOPROL XL) 100 MG 24 hr tablet Take 1 tablet (100 mg total) by mouth 2 (two) times daily.  30 tablet  11  . RAMIPRIL PO Take by mouth daily.           Past Medical History  Diagnosis Date  . HTN (hypertension)   . PAF (paroxysmal atrial fibrillation)     ROS:   All systems reviewed and negative except as noted in the HPI.   No past surgical history on file.   No family history on file.   History   Social History  . Marital Status: Married    Spouse Name: N/A    Number of Children: N/A  . Years of Education: N/A   Occupational History  . Not on file.   Social History Main Topics  . Smoking status: Never Smoker   . Smokeless tobacco: Not on file  . Alcohol Use: Not on file  . Drug Use: Not on file  . Sexually Active: Not on file   Other Topics Concern  . Not on file   Social History Narrative  . No narrative on file     BP 124/66  Pulse 77  Ht 6\' 3"  (1.905 m)  Wt 95.709 kg (211 lb)  BMI 26.37 kg/m2  Physical Exam:  Well appearing middle-aged man, NAD HEENT: Unremarkable Neck:  No JVD, no thyromegally Lymphatics:  No adenopathy Back:  No CVA tenderness Lungs:  Clear  with no wheezes, rales, or rhonchi. HEART:  Regular rate rhythm, no murmurs, no rubs, no clicks Abd:  soft, positive bowel sounds, no organomegally, no rebound, no guarding Ext:  2 plus pulses, no edema, no cyanosis, no clubbing Skin:  No rashes no nodules Neuro:  CN II through XII intact, motor grossly intact  EKG Normal sinus rhythm.    Assess/Plan:

## 2011-04-17 ENCOUNTER — Encounter (INDEPENDENT_AMBULATORY_CARE_PROVIDER_SITE_OTHER): Payer: BC Managed Care – PPO

## 2011-04-17 DIAGNOSIS — I4891 Unspecified atrial fibrillation: Secondary | ICD-10-CM

## 2011-04-29 ENCOUNTER — Other Ambulatory Visit: Payer: Self-pay | Admitting: Internal Medicine

## 2011-08-12 ENCOUNTER — Other Ambulatory Visit: Payer: Self-pay | Admitting: Internal Medicine

## 2011-08-13 ENCOUNTER — Other Ambulatory Visit: Payer: Self-pay

## 2011-08-13 DIAGNOSIS — I4891 Unspecified atrial fibrillation: Secondary | ICD-10-CM

## 2011-08-13 MED ORDER — METOPROLOL SUCCINATE ER 100 MG PO TB24: 100.0000 mg | ORAL_TABLET | Freq: Two times a day (BID) | ORAL | Status: DC

## 2011-10-28 ENCOUNTER — Other Ambulatory Visit: Payer: Self-pay | Admitting: Cardiology

## 2011-10-28 MED ORDER — DABIGATRAN ETEXILATE MESYLATE 150 MG PO CAPS: 150.0000 mg | ORAL_CAPSULE | Freq: Two times a day (BID) | ORAL | Status: DC

## 2011-12-10 ENCOUNTER — Other Ambulatory Visit: Payer: Self-pay | Admitting: Internal Medicine

## 2012-04-10 ENCOUNTER — Other Ambulatory Visit: Payer: Self-pay | Admitting: *Deleted

## 2012-04-10 MED ORDER — METOPROLOL SUCCINATE ER 100 MG PO TB24: 100.0000 mg | ORAL_TABLET | Freq: Two times a day (BID) | ORAL | Status: DC

## 2012-04-27 ENCOUNTER — Other Ambulatory Visit: Payer: Self-pay | Admitting: Internal Medicine

## 2012-05-07 ENCOUNTER — Other Ambulatory Visit: Payer: Self-pay | Admitting: Internal Medicine

## 2012-06-16 ENCOUNTER — Other Ambulatory Visit: Payer: Self-pay | Admitting: Internal Medicine

## 2012-08-27 ENCOUNTER — Other Ambulatory Visit: Payer: Self-pay | Admitting: Internal Medicine

## 2012-09-04 ENCOUNTER — Telehealth: Payer: Self-pay | Admitting: Internal Medicine

## 2012-09-04 NOTE — Telephone Encounter (Signed)
New problem    Pt says he's returning your call

## 2012-09-11 ENCOUNTER — Other Ambulatory Visit: Payer: Self-pay | Admitting: Cardiology

## 2012-09-11 ENCOUNTER — Other Ambulatory Visit: Payer: Self-pay | Admitting: Internal Medicine

## 2012-09-11 MED ORDER — METOPROLOL SUCCINATE ER 100 MG PO TB24: 100.0000 mg | ORAL_TABLET | Freq: Two times a day (BID) | ORAL | Status: DC

## 2012-09-25 ENCOUNTER — Other Ambulatory Visit: Payer: Self-pay | Admitting: Internal Medicine

## 2012-10-13 ENCOUNTER — Encounter: Payer: Self-pay | Admitting: Internal Medicine

## 2012-10-13 ENCOUNTER — Ambulatory Visit (INDEPENDENT_AMBULATORY_CARE_PROVIDER_SITE_OTHER): Payer: BC Managed Care – PPO | Admitting: Internal Medicine

## 2012-10-13 VITALS — BP 147/89 | HR 62 | Ht 75.0 in | Wt 215.0 lb

## 2012-10-13 DIAGNOSIS — I4891 Unspecified atrial fibrillation: Secondary | ICD-10-CM

## 2012-10-13 DIAGNOSIS — R0609 Other forms of dyspnea: Secondary | ICD-10-CM

## 2012-10-13 DIAGNOSIS — R0602 Shortness of breath: Secondary | ICD-10-CM

## 2012-10-13 MED ORDER — DABIGATRAN ETEXILATE MESYLATE 150 MG PO CAPS: 150.0000 mg | ORAL_CAPSULE | Freq: Two times a day (BID) | ORAL | Status: DC

## 2012-10-13 NOTE — Assessment & Plan Note (Signed)
The etiology of his dyspnea is unclear. It is clear that he has become more sedentary and we are not sure why. I've asked the patient undergo exercise treadmill testing and a 2-D echo. We would consider A. Fib ablation if his left atrial dimension were not too large. If he has evidence of left ventricular dysfunction, I would recommend heart catheterization. We'll await the results of his stress test and his echocardiogram.

## 2012-10-13 NOTE — Assessment & Plan Note (Signed)
The patient does have occasional palpitations. I do not have the sense however that his atrial fibrillation has been that, or frequent. His main complaint is fatigue. He will continue his current medical therapy for now.

## 2012-10-13 NOTE — Progress Notes (Signed)
HPI Nathan Neal returns today for followup. He is a 67 year old man with a long history of paroxysmal atrial fibrillation and hypertension. His symptoms have been well-controlled on flecainide therapy. The patient is a difficult historian as she tends to downplay his problems. His wife is with him today. They both note that his exercise tolerance has decreased. He has not had anginal symptoms but does note shortness of breath with exertion. He has to stop and rest. He has had no syncope. He has very little in the way of palpitations. He denies peripheral edema, nausea, or vomiting. No recent weight changes. He notes that he has become sedentary. No Known Allergies   Current Outpatient Prescriptions  Medication Sig Dispense Refill  . atorvastatin (LIPITOR) 10 MG tablet Take 10 mg by mouth daily.        . dabigatran (PRADAXA) 150 MG CAPS Take 1 capsule (150 mg total) by mouth every 12 (twelve) hours.  60 capsule  11  . flecainide (TAMBOCOR) 150 MG tablet TAKE ONE TABLET TWICE DAILY  60 tablet  5  . metoprolol succinate (TOPROL XL) 100 MG 24 hr tablet Take 1 tablet (100 mg total) by mouth 2 (two) times daily with a meal. Take with or immediately following a meal.  60 tablet  3  . zolpidem (AMBIEN CR) 12.5 MG CR tablet       . CIALIS 20 MG tablet        No current facility-administered medications for this visit.     Past Medical History  Diagnosis Date  . HTN (hypertension)   . PAF (paroxysmal atrial fibrillation)     ROS:   All systems reviewed and negative except as noted in the HPI.   History reviewed. No pertinent past surgical history.   No family history on file.   History   Social History  . Marital Status: Married    Spouse Name: N/A    Number of Children: N/A  . Years of Education: N/A   Occupational History  . Not on file.   Social History Main Topics  . Smoking status: Never Smoker   . Smokeless tobacco: Not on file  . Alcohol Use: Not on file  . Drug Use:  Not on file  . Sexually Active: Not on file   Other Topics Concern  . Not on file   Social History Narrative  . No narrative on file     BP 147/89  Pulse 62  Ht 6\' 3"  (1.905 m)  Wt 215 lb (97.523 kg)  BMI 26.87 kg/m2  Physical Exam:  Well appearing middle-aged man, NAD HEENT: Unremarkable Neck:  7 cm JVD, no thyromegally Back:  No CVA tenderness Lungs:  Clear with no wheezes, rales, or rhonchi. HEART:  Regular rate rhythm, no murmurs, no rubs, no clicks Abd:  soft, positive bowel sounds, no organomegally, no rebound, no guarding Ext:  2 plus pulses, no edema, no cyanosis, no clubbing Skin:  No rashes no nodules Neuro:  CN II through XII intact, motor grossly intact  EKG - normal sinus rhythm with first-degree AV block and voltage criteria for left ventricular hypertrophy  Assess/Plan:

## 2012-10-13 NOTE — Patient Instructions (Signed)
Your physician recommends that you schedule a follow-up appointment in: 4-6 weeks with Dr Ladona Ridgel  Your physician has requested that you have an exercise tolerance test. For further information please visit [email or web address] Please also follow instruction sheet, as given.  Your physician has requested that you have an echocardiogram. Echocardiography is a painless test that uses sound waves to create images of your heart. It provides your doctor with information about the size and shape of your heart and how well your heart's chambers and valves are working. This procedure takes approximately one hour. There are no restrictions for this procedure.

## 2012-10-21 ENCOUNTER — Ambulatory Visit (HOSPITAL_COMMUNITY): Payer: BC Managed Care – PPO | Attending: Internal Medicine | Admitting: Radiology

## 2012-10-21 DIAGNOSIS — R0989 Other specified symptoms and signs involving the circulatory and respiratory systems: Secondary | ICD-10-CM | POA: Insufficient documentation

## 2012-10-21 DIAGNOSIS — R0609 Other forms of dyspnea: Secondary | ICD-10-CM | POA: Insufficient documentation

## 2012-10-21 DIAGNOSIS — R0602 Shortness of breath: Secondary | ICD-10-CM

## 2012-10-21 DIAGNOSIS — I4891 Unspecified atrial fibrillation: Secondary | ICD-10-CM | POA: Insufficient documentation

## 2012-10-21 NOTE — Progress Notes (Signed)
Echocardiogram performed.  

## 2012-10-22 ENCOUNTER — Ambulatory Visit (INDEPENDENT_AMBULATORY_CARE_PROVIDER_SITE_OTHER): Payer: BC Managed Care – PPO | Admitting: Internal Medicine

## 2012-10-22 DIAGNOSIS — R0602 Shortness of breath: Secondary | ICD-10-CM

## 2012-10-22 DIAGNOSIS — I4891 Unspecified atrial fibrillation: Secondary | ICD-10-CM

## 2012-10-22 NOTE — Progress Notes (Signed)
Exercise Treadmill Test  Pre-Exercise Testing Evaluation Rhythm: normal sinus  Rate: 65                 Test  Exercise Tolerance Test Ordering MD: Lewayne Bunting, MD  Interpreting MD: Lewayne Bunting, MD  Unique Test No: 1  Treadmill:  2  Indication for ETT: exertional dyspnea  Contraindication to ETT: No   Stress Modality: exercise - treadmill  Cardiac Imaging Performed: non   Protocol: standard Bruce - maximal  Max BP:  196/97  Max MPHR (bpm):  156 85% MPR (bpm):  133  MPHR obtained (bpm):  97 % MPHR obtained:  62  Reached 85% MPHR (min:sec):  NA Total Exercise Time (min-sec):  6:00  Workload in METS:  7.0 Borg Scale: 17  Reason ETT Terminated:  fatigue    ST Segment Analysis At Rest: normal ST segments - no evidence of significant ST depression With Exercise: no evidence of significant ST depression  Other Information Arrhythmia:  No Angina during ETT:  absent (0) Quality of ETT:  diagnostic  ETT Interpretation:  normal - no evidence of ischemia by ST analysis  Comments: Poor functional capacity and heart rate response blunted by beta blockers  Recommendations: Will adjust medications and see him back in 2 months. Discussed the importance of regular daily exercise.

## 2012-11-12 ENCOUNTER — Telehealth: Payer: Self-pay | Admitting: Internal Medicine

## 2012-11-12 ENCOUNTER — Ambulatory Visit: Payer: BC Managed Care – PPO | Admitting: Internal Medicine

## 2012-11-12 NOTE — Telephone Encounter (Signed)
New Problem:    Patient's wife called in because she canceled her husband's appointment due to him falling ill and would like him to be reschedule with Dr. Ladona Ridgel.  Please call back.

## 2012-11-12 NOTE — Telephone Encounter (Signed)
Melissa will call the patient and reschedule him

## 2012-11-17 ENCOUNTER — Ambulatory Visit (INDEPENDENT_AMBULATORY_CARE_PROVIDER_SITE_OTHER): Payer: BC Managed Care – PPO | Admitting: Internal Medicine

## 2012-11-17 ENCOUNTER — Encounter: Payer: Self-pay | Admitting: Internal Medicine

## 2012-11-17 VITALS — BP 132/80 | HR 71 | Ht 75.0 in | Wt 215.6 lb

## 2012-11-17 DIAGNOSIS — R0989 Other specified symptoms and signs involving the circulatory and respiratory systems: Secondary | ICD-10-CM

## 2012-11-17 DIAGNOSIS — R0609 Other forms of dyspnea: Secondary | ICD-10-CM

## 2012-11-17 DIAGNOSIS — I4891 Unspecified atrial fibrillation: Secondary | ICD-10-CM

## 2012-11-17 MED ORDER — FLECAINIDE ACETATE 150 MG PO TABS: 150.0000 mg | ORAL_TABLET | Freq: Two times a day (BID) | ORAL | Status: DC

## 2012-11-17 MED ORDER — METOPROLOL TARTRATE 25 MG PO TABS: 25.0000 mg | ORAL_TABLET | Freq: Two times a day (BID) | ORAL | Status: DC

## 2012-11-17 NOTE — Progress Notes (Signed)
HPI Nathan Neal returns today for followup. He is a very pleasant 67 year old man with a history of paroxysmal atrial fibrillation and hypertension. He had developed increasing fatigue and weakness several months ago and underwent exercise treadmill testing which demonstrated poor exercise tolerance but was otherwise unremarkable. The etiology of his symptoms was unclear. Initially, I had the patient discontinue his Lipitor for several weeks. This made no difference in symptoms. We then asked the patient to reduce his dose of Toprol from 200 mg daily to 100 mg daily. His symptoms have improved. He has been bothered by cold however. He is walking his dog and his wife who is with him today states that his exercise tolerance is better. No syncope. He has minimal palpitations. No Known Allergies   Current Outpatient Prescriptions  Medication Sig Dispense Refill  . atorvastatin (LIPITOR) 10 MG tablet Take 10 mg by mouth daily.        . dabigatran (PRADAXA) 150 MG CAPS Take 1 capsule (150 mg total) by mouth every 12 (twelve) hours.  60 capsule  11  . flecainide (TAMBOCOR) 150 MG tablet Take 1 tablet (150 mg total) by mouth 2 (two) times daily.  60 tablet  5  . tadalafil (CIALIS) 20 MG tablet Take 20 mg by mouth as needed for erectile dysfunction.      Marland Kitchen zolpidem (AMBIEN CR) 12.5 MG CR tablet Take 12.5 mg by mouth at bedtime as needed for sleep.      . metoprolol tartrate (LOPRESSOR) 25 MG tablet Take 1 tablet (25 mg total) by mouth 2 (two) times daily.  60 tablet  5   No current facility-administered medications for this visit.     Past Medical History  Diagnosis Date  . HTN (hypertension)   . PAF (paroxysmal atrial fibrillation)     ROS:   All systems reviewed and negative except as noted in the HPI.   History reviewed. No pertinent past surgical history.   No family history on file.   History   Social History  . Marital Status: Married    Spouse Name: N/A    Number of Children:  N/A  . Years of Education: N/A   Occupational History  . Not on file.   Social History Main Topics  . Smoking status: Never Smoker   . Smokeless tobacco: Not on file  . Alcohol Use: Not on file  . Drug Use: Not on file  . Sexually Active: Not on file   Other Topics Concern  . Not on file   Social History Narrative  . No narrative on file     BP 132/80  Pulse 71  Ht 6\' 3"  (1.905 m)  Wt 215 lb 9.6 oz (97.796 kg)  BMI 26.95 kg/m2  Physical Exam:  Well appearing middle-aged man, NAD HEENT: Unremarkable Neck:  7 cm JVD, no thyromegally Lungs:  Clear with no wheezes, rales, or rhonchi. HEART:  Regular rate rhythm, no murmurs, no rubs, no clicks Abd:  soft, positive bowel sounds, no organomegally, no rebound, no guarding Ext:  2 plus pulses, no ed ema, no cyanosis, no clubbing Skin:  No rashes no nodules Neuro:  CN II through XII intact, motor grossly intact  EKG - normal sinus rhythm   Assess/Plan:

## 2012-11-17 NOTE — Assessment & Plan Note (Signed)
His symptoms are improved with reduction in his dose of beta blocker. I've encouraged him to increase his physical activity.

## 2012-11-17 NOTE — Patient Instructions (Addendum)
**Note De-Identified Tashanti Dalporto Obfuscation** Your physician has recommended you make the following change in your medication: stop taking Toprol and start taking Lopressor 25 mg twice daily  Your physician wants you to follow-up in: November You will receive a reminder letter in the mail two months in advance. If you don't receive a letter, please call our office to schedule the follow-up appointment.

## 2012-11-17 NOTE — Assessment & Plan Note (Signed)
His symptoms remain well controlled. He will continue on a lesser dose of beta blocker, switching from Toprol to metoprolol 25 mg twice daily. I've counseled him that if he has recurrent palpitations, he may increase his dose of metoprolol. He will continue flecainide.

## 2012-11-30 ENCOUNTER — Other Ambulatory Visit: Payer: Self-pay | Admitting: Internal Medicine

## 2013-02-22 ENCOUNTER — Encounter: Payer: BC Managed Care – PPO | Admitting: *Deleted

## 2013-05-24 ENCOUNTER — Other Ambulatory Visit: Payer: Self-pay | Admitting: Internal Medicine

## 2013-05-25 ENCOUNTER — Other Ambulatory Visit: Payer: Self-pay | Admitting: *Deleted

## 2013-05-25 MED ORDER — FLECAINIDE ACETATE 150 MG PO TABS: 150.0000 mg | ORAL_TABLET | Freq: Two times a day (BID) | ORAL | Status: DC

## 2013-06-28 ENCOUNTER — Other Ambulatory Visit: Payer: Self-pay | Admitting: Internal Medicine

## 2013-08-03 ENCOUNTER — Other Ambulatory Visit: Payer: Self-pay | Admitting: Internal Medicine

## 2013-09-07 ENCOUNTER — Encounter: Payer: Self-pay | Admitting: Gastroenterology

## 2013-09-08 ENCOUNTER — Other Ambulatory Visit: Payer: Self-pay | Admitting: Internal Medicine

## 2013-10-13 ENCOUNTER — Encounter: Payer: Medicare Other | Admitting: Gastroenterology

## 2013-10-19 ENCOUNTER — Other Ambulatory Visit: Payer: Self-pay | Admitting: *Deleted

## 2013-10-19 MED ORDER — METOPROLOL TARTRATE 25 MG PO TABS: ORAL_TABLET | ORAL | Status: DC

## 2013-10-19 MED ORDER — FLECAINIDE ACETATE 150 MG PO TABS: ORAL_TABLET | ORAL | Status: DC

## 2013-11-09 ENCOUNTER — Other Ambulatory Visit: Payer: Self-pay | Admitting: Internal Medicine

## 2013-11-09 ENCOUNTER — Encounter: Payer: Self-pay | Admitting: Internal Medicine

## 2013-11-09 ENCOUNTER — Ambulatory Visit (INDEPENDENT_AMBULATORY_CARE_PROVIDER_SITE_OTHER): Payer: Medicare Other | Admitting: Internal Medicine

## 2013-11-09 VITALS — BP 160/98 | HR 66 | Ht 75.0 in | Wt 220.0 lb

## 2013-11-09 DIAGNOSIS — R0602 Shortness of breath: Secondary | ICD-10-CM

## 2013-11-09 DIAGNOSIS — I4891 Unspecified atrial fibrillation: Secondary | ICD-10-CM

## 2013-11-09 DIAGNOSIS — I480 Paroxysmal atrial fibrillation: Secondary | ICD-10-CM

## 2013-11-09 DIAGNOSIS — I10 Essential (primary) hypertension: Secondary | ICD-10-CM

## 2013-11-09 MED ORDER — FLECAINIDE ACETATE 150 MG PO TABS: ORAL_TABLET | ORAL | Status: DC

## 2013-11-09 MED ORDER — METOPROLOL TARTRATE 25 MG PO TABS
ORAL_TABLET | ORAL | Status: DC
Start: 2013-11-09 — End: 2014-12-04

## 2013-11-09 MED ORDER — DABIGATRAN ETEXILATE MESYLATE 150 MG PO CAPS: 150.0000 mg | ORAL_CAPSULE | Freq: Two times a day (BID) | ORAL | Status: DC

## 2013-11-09 NOTE — Patient Instructions (Signed)
Your physician wants you to follow-up in: 12 months with Dr. Taylor. You will receive a reminder letter in the mail two months in advance. If you don't receive a letter, please call our office to schedule the follow-up appointment.    

## 2013-11-09 NOTE — Progress Notes (Signed)
HPI Nathan Neal returns today for followup. He is a very pleasant 68 year old man with a history of paroxysmal atrial fibrillation and hypertension. He had developed increasing fatigue and weakness over a year ago but is now improved. The etiology of his symptoms was unclear. Initially, I had the patient discontinue his Lipitor for several weeks. This made no difference in symptoms. We then asked the patient to reduce his dose of Toprol from 200 mg daily to 100 mg daily and now down to 50 mg daily in divided doses. His symptoms have improved.  He is walking his dog and his wife who is with him today states that his exercise tolerance is better. No syncope. He has minimal palpitations. He continues to drink 2-4 ETOH beverages daily (possibly more) but does not seem to be bothered by this level of consumption. No Known Allergies   Current Outpatient Prescriptions  Medication Sig Dispense Refill  . ALPRAZolam (XANAX) 0.5 MG tablet Take 0.25 mg by mouth at bedtime as needed for sleep.      Marland Kitchen atorvastatin (LIPITOR) 10 MG tablet Take 10 mg by mouth daily.        . benzonatate (TESSALON) 200 MG capsule Take 200 mg by mouth 3 (three) times daily as needed for cough.      . cefdinir (OMNICEF) 300 MG capsule Take 300 mg by mouth 2 (two) times daily.      . dabigatran (PRADAXA) 150 MG CAPS capsule Take 1 capsule (150 mg total) by mouth every 12 (twelve) hours.  60 capsule  11  . escitalopram (LEXAPRO) 20 MG tablet Take 20 mg by mouth daily.      . flecainide (TAMBOCOR) 150 MG tablet TAKE ONE TABLET TWICE DAILY  60 tablet  11  . MELATONIN PO Take 1 capsule by mouth as needed (sleep).      . metoprolol tartrate (LOPRESSOR) 25 MG tablet TAKE ONE TABLET TWICE DAILY (TO REPLACE TOPROL)  180 tablet  3  . predniSONE (DELTASONE) 20 MG tablet Take 20 mg by mouth as directed.      . tadalafil (CIALIS) 20 MG tablet Take 20 mg by mouth as needed for erectile dysfunction.      . Testosterone Cypionate 200 MG/ML KIT  Inject 0.5 mLs into the muscle every 14 (fourteen) days.       No current facility-administered medications for this visit.     Past Medical History  Diagnosis Date  . HTN (hypertension)   . PAF (paroxysmal atrial fibrillation)     ROS:   All systems reviewed and negative except as noted in the HPI.   History reviewed. No pertinent past surgical history.   No family history on file.   History   Social History  . Marital Status: Married    Spouse Name: N/A    Number of Children: N/A  . Years of Education: N/A   Occupational History  . Not on file.   Social History Main Topics  . Smoking status: Never Smoker   . Smokeless tobacco: Not on file  . Alcohol Use: Not on file  . Drug Use: Not on file  . Sexual Activity: Not on file   Other Topics Concern  . Not on file   Social History Narrative  . No narrative on file     BP 160/98  Pulse 66  Ht 6' 3"  (1.905 m)  Wt 220 lb (99.791 kg)  BMI 27.50 kg/m2  Physical Exam:  Well appearing middle-aged man, NAD HEENT:  Unremarkable Neck:  7 cm JVD, no thyromegally Lungs:  Clear with no wheezes, rales, or rhonchi. HEART:  Regular rate rhythm, no murmurs, no rubs, no clicks Abd:  soft, positive bowel sounds, no organomegally, no rebound, no guarding Ext:  2 plus pulses, no ed ema, no cyanosis, no clubbing Skin:  No rashes no nodules Neuro:  CN II through XII intact, motor grossly intact  EKG - normal sinus rhythm   Assess/Plan:

## 2013-11-09 NOTE — Assessment & Plan Note (Signed)
His blood pressure is high today but he has just finished his last dose of predisone. He will recheck at home and call if his pressure remain elevated.

## 2013-11-09 NOTE — Assessment & Plan Note (Signed)
His symptoms are controlled. I have recommended he continue his current medical therapy, reduce his ETOH consumption and increase his physical activiity.

## 2014-12-04 ENCOUNTER — Other Ambulatory Visit: Payer: Self-pay | Admitting: Internal Medicine

## 2014-12-14 ENCOUNTER — Other Ambulatory Visit: Payer: Self-pay | Admitting: Internal Medicine

## 2015-01-14 ENCOUNTER — Other Ambulatory Visit: Payer: Self-pay | Admitting: Internal Medicine

## 2015-01-20 ENCOUNTER — Telehealth: Payer: Self-pay | Admitting: *Deleted

## 2015-01-20 NOTE — Telephone Encounter (Signed)
called both [place] #'s no answer for either one.Marland KitchenMarland Kitchen

## 2015-01-25 ENCOUNTER — Ambulatory Visit (INDEPENDENT_AMBULATORY_CARE_PROVIDER_SITE_OTHER): Payer: Medicare Other | Admitting: Internal Medicine

## 2015-01-25 ENCOUNTER — Encounter: Payer: Self-pay | Admitting: Internal Medicine

## 2015-01-25 VITALS — BP 120/68 | HR 70 | Ht 74.0 in | Wt 225.6 lb

## 2015-01-25 DIAGNOSIS — I480 Paroxysmal atrial fibrillation: Secondary | ICD-10-CM

## 2015-01-25 MED ORDER — FLECAINIDE ACETATE 150 MG PO TABS: 150.0000 mg | ORAL_TABLET | Freq: Two times a day (BID) | ORAL | Status: DC

## 2015-01-25 MED ORDER — DABIGATRAN ETEXILATE MESYLATE 150 MG PO CAPS: ORAL_CAPSULE | ORAL | Status: DC

## 2015-01-25 MED ORDER — METOPROLOL TARTRATE 25 MG PO TABS: 25.0000 mg | ORAL_TABLET | Freq: Two times a day (BID) | ORAL | Status: DC

## 2015-01-25 NOTE — Patient Instructions (Signed)
Medication Instructions:  Your physician recommends that you continue on your current medications as directed. Please refer to the Current Medication list given to you today.  Labwork: None ordered  Testing/Procedures: None ordered  Follow-Up: Your physician wants you to follow-up in: 1 year with Dr. Taylor. You will receive a reminder letter in the mail two months in advance. If you don't receive a letter, please call our office to schedule the follow-up appointment.  Any Other Special Instructions Will Be Listed Below (If Applicable). Thank you for choosing [facility]!!       

## 2015-01-25 NOTE — Assessment & Plan Note (Signed)
His blood pressure is well controlled. Will follow. 

## 2015-01-25 NOTE — Assessment & Plan Note (Signed)
He is maintaining NSR. He will continue his dose of flecainide and low dose beta blocker.

## 2015-01-25 NOTE — Progress Notes (Signed)
HPI Nathan Neal returns today for followup. He is a very pleasant 69 year old man with a history of paroxysmal atrial fibrillation and hypertension. He had developed increasing fatigue and weakness over 2 years ago but is now improved. His symptoms have improved.  He is walking his dog and his wife who is with him today states that his exercise tolerance is better. No syncope. He has minimal palpitations. He has become more sedentary. He is spending more time in [place] where his daughter lives. No Known Allergies   Current Outpatient Prescriptions  Medication Sig Dispense Refill  . ALPRAZolam (XANAX) 0.5 MG tablet Take 0.25 mg by mouth at bedtime as needed for sleep.    Marland Kitchen atorvastatin (LIPITOR) 10 MG tablet Take 10 mg by mouth daily.      Marland Kitchen escitalopram (LEXAPRO) 20 MG tablet Take 20 mg by mouth daily.    . flecainide (TAMBOCOR) 150 MG tablet take 1 tablet by mouth twice a day 60 tablet 1  . MELATONIN PO Take 1 capsule by mouth as needed (sleep).    . metoprolol tartrate (LOPRESSOR) 25 MG tablet take 1 tablet by mouth twice a day 180 tablet 0  . PRADAXA 150 MG CAPS capsule take 1 capsule by mouth every 12 hours 60 capsule 0  . Testosterone Cypionate 200 MG/ML KIT Inject 0.5 mLs into the muscle every 14 (fourteen) days.     No current facility-administered medications for this visit.     Past Medical History  Diagnosis Date  . HTN (hypertension)   . PAF (paroxysmal atrial fibrillation)     ROS:   All systems reviewed and negative except as noted in the HPI.   No past surgical history on file.   Family History  Problem Relation Age of Onset  . Atrial fibrillation Mother   . Atrial fibrillation Father      Social History   Social History  . Marital Status: Married    Spouse Name: N/A  . Number of Children: N/A  . Years of Education: N/A   Occupational History  . Not on file.   Social History Main Topics  . Smoking status: Never Smoker   . Smokeless tobacco: Not  on file  . Alcohol Use: Not on file  . Drug Use: Not on file  . Sexual Activity: Not on file   Other Topics Concern  . Not on file   Social History Narrative     BP 120/68 mmHg  Pulse 70  Ht _0  (1.88 m)  Wt 225 lb 9.6 oz (102.331 kg)  BMI 28.95 kg/m2  Physical Exam:  Well appearing middle-aged man, NAD HEENT: Unremarkable Neck:  7 cm JVD, no thyromegally Lungs:  Clear with no wheezes, rales, or rhonchi. HEART:  Regular rate rhythm, no murmurs, no rubs, no clicks Abd:  soft, positive bowel sounds, no organomegally, no rebound, no guarding Ext:  2 plus pulses, no ed ema, no cyanosis, no clubbing Skin:  No rashes no nodules Neuro:  CN II through XII intact, motor grossly intact  EKG - normal sinus rhythm with out significant QRS widening.   Assess/Plan:

## 2015-01-25 NOTE — Addendum Note (Signed)
Addended by: Stanton Kidney on: 01/25/2015 04:57 PM   Modules accepted: Orders

## 2015-11-06 DIAGNOSIS — E784 Other hyperlipidemia: Secondary | ICD-10-CM | POA: Diagnosis not present

## 2015-11-06 DIAGNOSIS — I10 Essential (primary) hypertension: Secondary | ICD-10-CM | POA: Diagnosis not present

## 2015-11-06 DIAGNOSIS — N200 Calculus of kidney: Secondary | ICD-10-CM | POA: Diagnosis not present

## 2015-11-06 DIAGNOSIS — F339 Major depressive disorder, recurrent, unspecified: Secondary | ICD-10-CM | POA: Diagnosis not present

## 2015-11-06 DIAGNOSIS — M109 Gout, unspecified: Secondary | ICD-10-CM | POA: Diagnosis not present

## 2015-11-06 DIAGNOSIS — R7302 Impaired glucose tolerance (oral): Secondary | ICD-10-CM | POA: Diagnosis not present

## 2015-11-06 DIAGNOSIS — E538 Deficiency of other specified B group vitamins: Secondary | ICD-10-CM | POA: Diagnosis not present

## 2015-11-06 DIAGNOSIS — E539 Vitamin B deficiency, unspecified: Secondary | ICD-10-CM | POA: Diagnosis not present

## 2015-11-06 DIAGNOSIS — I480 Paroxysmal atrial fibrillation: Secondary | ICD-10-CM | POA: Diagnosis not present

## 2015-11-06 DIAGNOSIS — E291 Testicular hypofunction: Secondary | ICD-10-CM | POA: Diagnosis not present

## 2015-11-06 DIAGNOSIS — Z0000 Encounter for general adult medical examination without abnormal findings: Secondary | ICD-10-CM | POA: Diagnosis not present

## 2015-11-29 DIAGNOSIS — Z1211 Encounter for screening for malignant neoplasm of colon: Secondary | ICD-10-CM | POA: Diagnosis not present

## 2015-11-29 DIAGNOSIS — Z1212 Encounter for screening for malignant neoplasm of rectum: Secondary | ICD-10-CM | POA: Diagnosis not present

## 2016-02-05 ENCOUNTER — Other Ambulatory Visit: Payer: Self-pay | Admitting: Internal Medicine

## 2016-03-22 ENCOUNTER — Encounter: Payer: Self-pay | Admitting: Internal Medicine

## 2016-03-22 ENCOUNTER — Encounter (INDEPENDENT_AMBULATORY_CARE_PROVIDER_SITE_OTHER): Payer: Self-pay

## 2016-03-22 ENCOUNTER — Ambulatory Visit (INDEPENDENT_AMBULATORY_CARE_PROVIDER_SITE_OTHER): Payer: Medicare Other | Admitting: Internal Medicine

## 2016-03-22 VITALS — BP 120/70 | HR 77 | Ht 74.0 in | Wt 208.4 lb

## 2016-03-22 DIAGNOSIS — I480 Paroxysmal atrial fibrillation: Secondary | ICD-10-CM

## 2016-03-22 MED ORDER — DABIGATRAN ETEXILATE MESYLATE 150 MG PO CAPS: ORAL_CAPSULE | ORAL | 3 refills | Status: DC

## 2016-03-22 MED ORDER — METOPROLOL TARTRATE 25 MG PO TABS: 25.0000 mg | ORAL_TABLET | Freq: Two times a day (BID) | ORAL | 3 refills | Status: DC

## 2016-03-22 MED ORDER — FLECAINIDE ACETATE 150 MG PO TABS: 150.0000 mg | ORAL_TABLET | Freq: Two times a day (BID) | ORAL | 3 refills | Status: DC

## 2016-03-22 NOTE — Progress Notes (Signed)
HPI Nathan Neal returns today for followup. He is a very pleasant 70 year old man with a history of paroxysmal atrial fibrillation and hypertension. He had developed increasing fatigue and weakness over 2 years ago but is now improved. His symptoms have improved. He is living in [place] now, closer to his daughter. He denies chest pain or sob. Rare palpitations and no sustained atrial fib.   No Known Allergies   Current Outpatient Prescriptions  Medication Sig Dispense Refill  . allopurinol (ZYLOPRIM) 100 MG tablet Take 100 mg by mouth daily.  0  . ALPRAZolam (XANAX) 0.5 MG tablet Take 0.25 mg by mouth at bedtime as needed for sleep.    Marland Kitchen atorvastatin (LIPITOR) 10 MG tablet Take 10 mg by mouth daily.      . dabigatran (PRADAXA) 150 MG CAPS capsule take 1 capsule by mouth every 12 hours 180 capsule 3  . escitalopram (LEXAPRO) 20 MG tablet Take 20 mg by mouth daily.    . flecainide (TAMBOCOR) 150 MG tablet Take 1 tablet (150 mg total) by mouth 2 (two) times daily. 180 tablet 3  . MELATONIN PO Take 1 capsule by mouth at bedtime as needed (sleep).     . metoprolol tartrate (LOPRESSOR) 25 MG tablet Take 1 tablet (25 mg total) by mouth 2 (two) times daily. 180 tablet 3   No current facility-administered medications for this visit.      Past Medical History:  Diagnosis Date  . HTN (hypertension)   . PAF (paroxysmal atrial fibrillation) (HCC)     ROS:   All systems reviewed and negative except as noted in the HPI.   No past surgical history on file.   Family History  Problem Relation Age of Onset  . Atrial fibrillation Mother   . Atrial fibrillation Father      Social History   Social History  . Marital status: Married    Spouse name: N/A  . Number of children: N/A  . Years of education: N/A   Occupational History  . Not on file.   Social History Main Topics  . Smoking status: Never Smoker  . Smokeless tobacco: Not on file  . Alcohol use Not on file  . Drug use:  Unknown  . Sexual activity: Not on file   Other Topics Concern  . Not on file   Social History Narrative  . No narrative on file     BP 120/70   Pulse 77   Ht 6\' 2"  (1.88 m)   Wt 208 lb 6.4 oz (94.5 kg)   BMI 26.76 kg/m   Physical Exam:  Well appearing middle-aged man, NAD HEENT: Unremarkable Neck:  7 cm JVD, no thyromegally Lungs:  Clear with no wheezes, rales, or rhonchi. HEART:  Regular rate rhythm, no murmurs, no rubs, no clicks Abd:  soft, positive bowel sounds, no organomegally, no rebound, no guarding Ext:  2 plus pulses, no ed ema, no cyanosis, no clubbing Skin:  No rashes no nodules Neuro:  CN II through XII intact, motor grossly intact  EKG - normal sinus rhythm with out significant QRS widening.   Assess/Plan: 1. PAF - he will continue his dose of flecainide. He will continue his anti-coagulation. 2. Coags - he is tolerating the Pradaxa well. 3. HTN - he will continue his beta blocker.   Cristopher Peru, M.D.

## 2016-03-22 NOTE — Patient Instructions (Signed)

## 2016-06-03 DIAGNOSIS — I10 Essential (primary) hypertension: Secondary | ICD-10-CM | POA: Diagnosis not present

## 2016-06-03 DIAGNOSIS — E538 Deficiency of other specified B group vitamins: Secondary | ICD-10-CM | POA: Diagnosis not present

## 2016-06-03 DIAGNOSIS — Z1389 Encounter for screening for other disorder: Secondary | ICD-10-CM | POA: Diagnosis not present

## 2016-06-03 DIAGNOSIS — Z23 Encounter for immunization: Secondary | ICD-10-CM | POA: Diagnosis not present

## 2016-06-03 DIAGNOSIS — R7302 Impaired glucose tolerance (oral): Secondary | ICD-10-CM | POA: Diagnosis not present

## 2016-06-03 DIAGNOSIS — M109 Gout, unspecified: Secondary | ICD-10-CM | POA: Diagnosis not present

## 2016-06-03 DIAGNOSIS — Z6826 Body mass index (BMI) 26.0-26.9, adult: Secondary | ICD-10-CM | POA: Diagnosis not present

## 2016-06-03 DIAGNOSIS — F339 Major depressive disorder, recurrent, unspecified: Secondary | ICD-10-CM | POA: Diagnosis not present

## 2016-07-18 ENCOUNTER — Other Ambulatory Visit: Payer: Self-pay | Admitting: Internal Medicine

## 2016-07-18 DIAGNOSIS — R590 Localized enlarged lymph nodes: Secondary | ICD-10-CM | POA: Diagnosis not present

## 2016-07-18 DIAGNOSIS — Z6826 Body mass index (BMI) 26.0-26.9, adult: Secondary | ICD-10-CM | POA: Diagnosis not present

## 2016-07-19 ENCOUNTER — Ambulatory Visit
Admission: RE | Admit: 2016-07-19 | Discharge: 2016-07-19 | Disposition: A | Discharge status: Home / Self Care | Payer: Medicare Other | Source: Ambulatory Visit | Attending: Internal Medicine | Admitting: Internal Medicine

## 2016-07-19 DIAGNOSIS — R590 Localized enlarged lymph nodes: Secondary | ICD-10-CM

## 2016-07-19 MED ORDER — IOPAMIDOL (ISOVUE-300) INJECTION 61%: 75.0000 mL | Freq: Once | INTRAVENOUS | Status: DC | PRN

## 2016-07-25 ENCOUNTER — Other Ambulatory Visit: Payer: Self-pay | Admitting: Internal Medicine

## 2016-07-25 ENCOUNTER — Other Ambulatory Visit (HOSPITAL_COMMUNITY): Payer: Self-pay | Admitting: Internal Medicine

## 2016-07-25 DIAGNOSIS — N529 Male erectile dysfunction, unspecified: Secondary | ICD-10-CM

## 2016-07-25 DIAGNOSIS — R590 Localized enlarged lymph nodes: Secondary | ICD-10-CM

## 2016-08-02 ENCOUNTER — Other Ambulatory Visit: Payer: Self-pay | Admitting: Radiology

## 2016-08-05 ENCOUNTER — Encounter (HOSPITAL_COMMUNITY): Payer: Self-pay

## 2016-08-05 ENCOUNTER — Ambulatory Visit (HOSPITAL_COMMUNITY)
Admission: RE | Admit: 2016-08-05 | Discharge: 2016-08-05 | Disposition: A | Discharge status: Home / Self Care | Payer: Medicare Other | Source: Ambulatory Visit | Attending: Internal Medicine | Admitting: Internal Medicine

## 2016-08-05 DIAGNOSIS — Z79899 Other long term (current) drug therapy: Secondary | ICD-10-CM | POA: Diagnosis not present

## 2016-08-05 DIAGNOSIS — R590 Localized enlarged lymph nodes: Secondary | ICD-10-CM

## 2016-08-05 DIAGNOSIS — I10 Essential (primary) hypertension: Secondary | ICD-10-CM | POA: Insufficient documentation

## 2016-08-05 DIAGNOSIS — Z7901 Long term (current) use of anticoagulants: Secondary | ICD-10-CM | POA: Diagnosis not present

## 2016-08-05 DIAGNOSIS — N529 Male erectile dysfunction, unspecified: Secondary | ICD-10-CM | POA: Diagnosis not present

## 2016-08-05 DIAGNOSIS — C801 Malignant (primary) neoplasm, unspecified: Secondary | ICD-10-CM | POA: Diagnosis not present

## 2016-08-05 DIAGNOSIS — Z8249 Family history of ischemic heart disease and other diseases of the circulatory system: Secondary | ICD-10-CM | POA: Insufficient documentation

## 2016-08-05 DIAGNOSIS — C969 Malignant neoplasm of lymphoid, hematopoietic and related tissue, unspecified: Secondary | ICD-10-CM | POA: Diagnosis not present

## 2016-08-05 DIAGNOSIS — R531 Weakness: Secondary | ICD-10-CM | POA: Diagnosis not present

## 2016-08-05 DIAGNOSIS — I480 Paroxysmal atrial fibrillation: Secondary | ICD-10-CM | POA: Insufficient documentation

## 2016-08-05 DIAGNOSIS — C770 Secondary and unspecified malignant neoplasm of lymph nodes of head, face and neck: Secondary | ICD-10-CM | POA: Diagnosis not present

## 2016-08-05 LAB — CBC WITH DIFFERENTIAL/PLATELET
Basophils Absolute: 0.1 10*3/uL (ref 0.0–0.1)
Basophils Relative: 1 %
EOS ABS: 0.1 10*3/uL (ref 0.0–0.7)
EOS PCT: 2 %
HCT: 42.3 % (ref 39.0–52.0)
Hemoglobin: 14 g/dL (ref 13.0–17.0)
LYMPHS ABS: 1.2 10*3/uL (ref 0.7–4.0)
Lymphocytes Relative: 13 %
MCH: 31.7 pg (ref 26.0–34.0)
MCHC: 33.1 g/dL (ref 30.0–36.0)
MCV: 95.7 fL (ref 78.0–100.0)
MONO ABS: 0.6 10*3/uL (ref 0.1–1.0)
MONOS PCT: 7 %
Neutro Abs: 7.1 10*3/uL (ref 1.7–7.7)
Neutrophils Relative %: 77 %
PLATELETS: 218 10*3/uL (ref 150–400)
RBC: 4.42 MIL/uL (ref 4.22–5.81)
RDW: 13.3 % (ref 11.5–15.5)
WBC: 9.1 10*3/uL (ref 4.0–10.5)

## 2016-08-05 LAB — PROTIME-INR
INR: 1.02
PROTHROMBIN TIME: 13.4 s (ref 11.4–15.2)

## 2016-08-05 LAB — BASIC METABOLIC PANEL
Anion gap: 11 (ref 5–15)
BUN: 19 mg/dL (ref 6–20)
CHLORIDE: 104 mmol/L (ref 101–111)
CO2: 24 mmol/L (ref 22–32)
CREATININE: 1.1 mg/dL (ref 0.61–1.24)
Calcium: 9.3 mg/dL (ref 8.9–10.3)
GFR calc Af Amer: >60 mL/min (ref >60)
GFR calc non Af Amer: >60 mL/min (ref >60)
Glucose, Bld: 82 mg/dL (ref 65–99)
Potassium: 4.4 mmol/L (ref 3.5–5.1)
Sodium: 139 mmol/L (ref 135–145)

## 2016-08-05 MED ORDER — MIDAZOLAM HCL 5 MG/5ML IJ SOLN
INTRAMUSCULAR | Status: AC | PRN
  Administered 2016-08-05: 1 mg via INTRAVENOUS

## 2016-08-05 MED ORDER — MIDAZOLAM HCL 2 MG/2ML IJ SOLN
INTRAMUSCULAR | Status: AC | PRN
  Administered 2016-08-05: 1 mg via INTRAVENOUS

## 2016-08-05 MED ORDER — FENTANYL CITRATE (PF) 100 MCG/2ML IJ SOLN
INTRAMUSCULAR | Status: AC | PRN
  Administered 2016-08-05: 50 ug via INTRAVENOUS

## 2016-08-05 MED ORDER — MIDAZOLAM HCL 2 MG/2ML IJ SOLN
INTRAMUSCULAR | Status: AC
  Filled 2016-08-05: qty 6

## 2016-08-05 MED ORDER — SODIUM CHLORIDE 0.9 % IV SOLN
INTRAVENOUS | Status: DC
  Administered 2016-08-05: 12:00:00 via INTRAVENOUS

## 2016-08-05 MED ORDER — FENTANYL CITRATE (PF) 100 MCG/2ML IJ SOLN
INTRAMUSCULAR | Status: AC
  Filled 2016-08-05: qty 4

## 2016-08-05 NOTE — Discharge Instructions (Signed)
Needle Biopsy, Care After °These instructions give you information about caring for yourself after your procedure. Your doctor may also give you more specific instructions. Call your doctor if you have any problems or questions after your procedure. °Follow these instructions at home: °· Rest as told by your doctor. °· Take medicines only as told by your doctor. °· There are many different ways to close and cover the biopsy site, including stitches (sutures), skin glue, and adhesive strips. Follow instructions from your doctor about: °¨ How to take care of your biopsy site. °¨ When and how you should change your bandage (dressing). °¨ When you should remove your dressing. °¨ Removing whatever was used to close your biopsy site. °· Check your biopsy site every day for signs of infection. Watch for: °¨ Redness, swelling, or pain. °¨ Fluid, blood, or pus. °Contact a doctor if: °· You have a fever. °· You have redness, swelling, or pain at the biopsy site, and it lasts longer than a few days. °· You have fluid, blood, or pus coming from the biopsy site. °· You feel sick to your stomach (nauseous). °· You throw up (vomit). °Get help right away if: °· You are short of breath. °· You have trouble breathing. °· Your chest hurts. °· You feel dizzy or you pass out (faint). °· You have bleeding that does not stop with pressure or a bandage. °· You cough up blood. °· Your belly (abdomen) hurts. °This information is not intended to replace advice given to you by your health care provider. Make sure you discuss any questions you have with your health care provider. °Document Released: 04/11/2008 Document Revised: 10/05/2015 Document Reviewed: 04/25/2014 °Elsevier Interactive Patient Education © 2017 [employer] ° ° °Moderate Conscious Sedation, Adult, Care After °These instructions provide you with information about caring for yourself after your procedure. Your health care provider may also give you more specific instructions.  Your treatment has been planned according to current medical practices, but problems sometimes occur. Call your health care provider if you have any problems or questions after your procedure. °What can I expect after the procedure? °After your procedure, it is common: °· To feel sleepy for several hours. °· To feel clumsy and have poor balance for several hours. °· To have poor judgment for several hours. °· To vomit if you eat too soon. °Follow these instructions at home: °For at least 24 hours after the procedure:  ° °· Do not: °¨ Participate in activities where you could fall or become injured. °¨ Drive. °¨ Use heavy machinery. °¨ Drink alcohol. °¨ Take sleeping pills or medicines that cause drowsiness. °¨ Make important decisions or sign legal documents. °¨ Take care of children on your own. °· Rest. °Eating and drinking  °· Follow the diet recommended by your health care provider. °· If you vomit: °¨ Drink water, juice, or soup when you can drink without vomiting. °¨ Make sure you have little or no nausea before eating solid foods. °General instructions  °· Have a responsible adult stay with you until you are awake and alert. °· Take over-the-counter and prescription medicines only as told by your health care provider. °· If you smoke, do not smoke without supervision. °· Keep all follow-up visits as told by your health care provider. This is important. °Contact a health care provider if: °· You keep feeling nauseous or you keep vomiting. °· You feel light-headed. °· You develop a rash. °· You have a fever. °Get help right   away if: °· You have trouble breathing. °This information is not intended to replace advice given to you by your health care provider. Make sure you discuss any questions you have with your health care provider. °Document Released: 02/17/2013 Document Revised: 10/02/2015 Document Reviewed: 08/19/2015 °Elsevier Interactive Patient Education © 2017 Elsevier Inc. ° °

## 2016-08-05 NOTE — H&P (Signed)
Chief Complaint: Patient was seen in consultation today for cervical lymphadenopathy  Referring Physician(s): Dr. Shon Baton  Supervising Physician: Sandi Mariscal  Patient Status: [facility] - Out-pt  History of Present Illness: Nathan Neal is a 71 y.o. male with history of atrial fibrillation and HTN presents with complaint of fatigue and weakness as well as enlarging lymph nodes in his neck.   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 Recommend MRI of the neck with contrast for further characterization and, direct inspection/histopathologic correlation. -Necrotic RIGHT neck lymphadenopathy with extracapsular spread of disease consistent with metastasis. 8 mm LEFT level 3 necrotic lymph node. -These results will be called to the ordering clinician or representative by the Radiologist Assistant, and communication documented in the zVision Dashboard.  IR consulted for biopsy of the cervical lymphadenopathy at the request of Dr. Virgina Jock.  Case reviewed by Dr. Barbie Banner who approves patient for procedure.   Patient has been NPO.  He does not take blood thinners.  He has been in his usual state of health aside from post nasal drip associated with seasonal allergies.     Past Medical History:  Diagnosis Date  . HTN (hypertension)   . PAF (paroxysmal atrial fibrillation) ([place])     History reviewed. No pertinent surgical history.  Allergies: Patient has no known allergies.  Medications: Prior to Admission medications   Medication Sig Start Date End Date Taking? Authorizing Provider  allopurinol (ZYLOPRIM) 100 MG tablet Take 100 mg by mouth daily. 02/11/16   Historical Provider, MD  ALPRAZolam Duanne Moron) 0.5 MG tablet Take 0.25 mg by mouth at bedtime as needed for sleep.    Historical Provider, MD  atorvastatin (LIPITOR) 10 MG tablet Take 10 mg by mouth daily.      Historical Provider, MD    dabigatran (PRADAXA) 150 MG CAPS capsule take 1 capsule by mouth every 12 hours 03/22/16   Evans Lance, MD  escitalopram (LEXAPRO) 20 MG tablet Take 20 mg by mouth daily.    Historical Provider, MD  flecainide (TAMBOCOR) 150 MG tablet Take 1 tablet (150 mg total) by mouth 2 (two) times daily. 03/22/16   Evans Lance, MD  MELATONIN PO Take 1 capsule by mouth at bedtime as needed (sleep).     Historical Provider, MD  metoprolol tartrate (LOPRESSOR) 25 MG tablet Take 1 tablet (25 mg total) by mouth 2 (two) times daily. 03/22/16   Evans Lance, MD     Family History  Problem Relation Age of Onset  . Atrial fibrillation Mother   . Atrial fibrillation Father     Social History   Social History  . Marital status: Married    Spouse name: N/A  . Number of children: N/A  . Years of education: N/A   Social History Main Topics  . Smoking status: Never Smoker  . Smokeless tobacco: Never Used  . Alcohol use None  . Drug use: Unknown  . Sexual activity: Not Asked   Other Topics Concern  . None   Social History Narrative  . None     Review of Systems  Constitutional: Negative for fatigue and fever.  HENT: Positive for postnasal drip.   Respiratory: Negative for cough and shortness of breath.   Cardiovascular: Negative for chest pain.  Gastrointestinal: Negative for abdominal pain.  Psychiatric/Behavioral: Negative for behavioral problems and confusion.    Vital Signs: BP (!) 153/67 (BP Location: Right  Arm)   Pulse 63   Temp 98.1 F (36.7 C) (Oral)   Resp 18   SpO2 100%   Physical Exam  Constitutional: He appears well-developed.  Cardiovascular: Normal rate, regular rhythm and normal heart sounds.   Pulmonary/Chest: Effort normal and breath sounds normal. No respiratory distress.  Lymphadenopathy:    He has cervical adenopathy (palpable on the right).  Nursing note and vitals reviewed.   Mallampati Score:  MD Evaluation Airway: WNL Heart: WNL Abdomen:  WNL Chest/ Lungs: WNL ASA  Classification: 3 Mallampati/Airway Score: Three  Imaging: Ct Soft Tissue Neck W Contrast  Result Date: 07/19/2016 CLINICAL DATA:  Bilateral lymphadenopathy for 2 months. History of testicular cancer, hypertension. EXAM: CT NECK WITH CONTRAST TECHNIQUE: Multidetector CT imaging of the neck was performed using the standard protocol following the bolus administration of intravenous contrast. CONTRAST:  75 cc Isovue 300 COMPARISON:  MRI of the brain August 02, 2004 FINDINGS: PHARYNX AND LARYNX: Ill-defined 13 x 13 x 24 mm hypoenhancing mass RIGHT hypopharyngeal fat planes contiguous with piriform sinus apex (coronal 24/123. Normal larynx. SALIVARY GLANDS: Normal. THYROID: Normal. LYMPH NODES: Necrotic 14 x 29 mm nodal conglomeration with extra capsular fat stranding RIGHT level 3/for. Round RIGHT level 3 and 13 mm round RIGHT level 2 a lymph nodes. 9 mm LEFT level 3 necrotic lymph node. Additional tiny scattered round level three and 4 lymph nodes bilaterally. VASCULAR: Severe calcific atherosclerosis of the carotid bifurcation, vessels are patent. LIMITED INTRACRANIAL: Normal. VISUALIZED ORBITS: Normal. MASTOIDS AND VISUALIZED PARANASAL SINUSES: Mild lobulated maxillary sinus mucosal thickening without air-fluid levels. Mastoid air cells are well aerated. SKELETON: Nonacute. Severe C5-6 and moderate to severe C4-5 and C6-7 degenerative discs multilevel moderate facet arthropathy. Severe RIGHT C3-4, severe LEFT C4-5 and LEFT C5-6 neural foraminal narrowing. UPPER CHEST: Lung apices are clear. No superior mediastinal lymphadenopathy. OTHER: None. 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 Recommend MRI of the neck with contrast for further characterization and, direct inspection/histopathologic correlation. Necrotic RIGHT neck lymphadenopathy with extracapsular spread of disease consistent with metastasis. 8 mm  LEFT level 3 necrotic lymph node. These results will be called to the ordering clinician or representative by the Radiologist Assistant, and communication documented in the zVision Dashboard. Electronically Signed   By: Elon Alas M.D.   On: 07/19/2016 16:53    Labs:  CBC:  Recent Labs  08/05/16 1124  WBC 9.1  HGB 14.0  HCT 42.3  PLT 218    COAGS: No results for input(s): INR, APTT in the last 8760 hours.  BMP: No results for input(s): NA, K, CL, CO2, GLUCOSE, BUN, CALCIUM, CREATININE, GFRNONAA, GFRAA in the last 8760 hours.  Invalid input(s): CMP  LIVER FUNCTION TESTS: No results for input(s): BILITOT, AST, ALT, ALKPHOS, PROT, ALBUMIN in the last 8760 hours.  TUMOR MARKERS: No results for input(s): AFPTM, CEA, CA199, CHROMGRNA in the last 8760 hours.  Assessment and Plan: Cervical Lymphadenopathy Patient with past medical history of HTN and paroxysmal atrial fibrillation presents with weakness and fatigue, now with recent findings of cervical lymphadenopathy. IR consulted for lymph node biopsy at the request of Dr. Virgina Jock.  Patient presents for procedure today.  He has been NPO, does not take blood thinners, and has been in his usual state of health.  Risks and Benefits discussed with the patient including, but not limited to bleeding, infection, damage to adjacent structures or low yield requiring additional tests. All of  the patient's questions were answered, patient is agreeable to proceed. Consent signed and in chart.  Thank you for this interesting consult.  I greatly enjoyed meeting Nathan Neal and look forward to participating in their care.  A copy of this report was sent to the requesting provider on this date.  Electronically Signed: Docia Barrier 08/05/2016, 11:54 AM   I spent a total of  30 Minutes   in face to face in clinical consultation, greater than 50% of which was counseling/coordinating care for cervical  lymphadenopathy

## 2016-08-05 NOTE — Procedures (Signed)
Pre Procedure Dx: Cervical LAN Post Procedural Dx: Same  Technically successful US guided biopsy of right cervical LN.  EBL: None  No immediate complications.   Ronny Bacon, MD Pager #: [phone]

## 2016-08-06 DIAGNOSIS — C139 Malignant neoplasm of hypopharynx, unspecified: Secondary | ICD-10-CM | POA: Diagnosis not present

## 2016-08-09 DIAGNOSIS — C770 Secondary and unspecified malignant neoplasm of lymph nodes of head, face and neck: Secondary | ICD-10-CM | POA: Diagnosis not present

## 2016-08-13 DIAGNOSIS — Z8547 Personal history of malignant neoplasm of testis: Secondary | ICD-10-CM | POA: Diagnosis not present

## 2016-08-13 DIAGNOSIS — C139 Malignant neoplasm of hypopharynx, unspecified: Secondary | ICD-10-CM | POA: Diagnosis not present

## 2016-08-19 DIAGNOSIS — K05321 Chronic periodontitis, generalized, slight: Secondary | ICD-10-CM | POA: Diagnosis not present

## 2016-08-19 DIAGNOSIS — C139 Malignant neoplasm of hypopharynx, unspecified: Secondary | ICD-10-CM | POA: Diagnosis not present

## 2016-08-26 DIAGNOSIS — Z01818 Encounter for other preprocedural examination: Secondary | ICD-10-CM | POA: Diagnosis not present

## 2016-08-26 DIAGNOSIS — E782 Mixed hyperlipidemia: Secondary | ICD-10-CM | POA: Diagnosis not present

## 2016-08-26 DIAGNOSIS — I480 Paroxysmal atrial fibrillation: Secondary | ICD-10-CM | POA: Diagnosis not present

## 2016-08-26 DIAGNOSIS — I6523 Occlusion and stenosis of bilateral carotid arteries: Secondary | ICD-10-CM | POA: Diagnosis not present

## 2016-08-29 DIAGNOSIS — C139 Malignant neoplasm of hypopharynx, unspecified: Secondary | ICD-10-CM | POA: Diagnosis not present

## 2016-08-29 DIAGNOSIS — F17210 Nicotine dependence, cigarettes, uncomplicated: Secondary | ICD-10-CM | POA: Diagnosis not present

## 2016-09-09 DIAGNOSIS — Z79899 Other long term (current) drug therapy: Secondary | ICD-10-CM | POA: Diagnosis not present

## 2016-09-09 DIAGNOSIS — F17290 Nicotine dependence, other tobacco product, uncomplicated: Secondary | ICD-10-CM | POA: Diagnosis not present

## 2016-09-09 DIAGNOSIS — C140 Malignant neoplasm of pharynx, unspecified: Secondary | ICD-10-CM | POA: Diagnosis not present

## 2016-09-09 DIAGNOSIS — Z931 Gastrostomy status: Secondary | ICD-10-CM | POA: Diagnosis not present

## 2016-09-09 DIAGNOSIS — I10 Essential (primary) hypertension: Secondary | ICD-10-CM | POA: Diagnosis not present

## 2016-09-09 DIAGNOSIS — I480 Paroxysmal atrial fibrillation: Secondary | ICD-10-CM | POA: Diagnosis not present

## 2016-09-09 DIAGNOSIS — C139 Malignant neoplasm of hypopharynx, unspecified: Secondary | ICD-10-CM | POA: Diagnosis not present

## 2016-09-09 DIAGNOSIS — C138 Malignant neoplasm of overlapping sites of hypopharynx: Secondary | ICD-10-CM | POA: Diagnosis not present

## 2016-09-10 DIAGNOSIS — C138 Malignant neoplasm of overlapping sites of hypopharynx: Secondary | ICD-10-CM | POA: Diagnosis not present

## 2016-09-10 DIAGNOSIS — F17210 Nicotine dependence, cigarettes, uncomplicated: Secondary | ICD-10-CM | POA: Diagnosis not present

## 2016-09-10 DIAGNOSIS — C770 Secondary and unspecified malignant neoplasm of lymph nodes of head, face and neck: Secondary | ICD-10-CM | POA: Diagnosis not present

## 2016-09-10 DIAGNOSIS — C139 Malignant neoplasm of hypopharynx, unspecified: Secondary | ICD-10-CM | POA: Diagnosis not present

## 2016-09-13 DIAGNOSIS — F17210 Nicotine dependence, cigarettes, uncomplicated: Secondary | ICD-10-CM | POA: Diagnosis not present

## 2016-09-13 DIAGNOSIS — C139 Malignant neoplasm of hypopharynx, unspecified: Secondary | ICD-10-CM | POA: Diagnosis not present

## 2016-09-17 DIAGNOSIS — F17210 Nicotine dependence, cigarettes, uncomplicated: Secondary | ICD-10-CM | POA: Diagnosis not present

## 2016-09-17 DIAGNOSIS — Z510 Encounter for antineoplastic radiation therapy: Secondary | ICD-10-CM | POA: Diagnosis not present

## 2016-09-17 DIAGNOSIS — C139 Malignant neoplasm of hypopharynx, unspecified: Secondary | ICD-10-CM | POA: Diagnosis not present

## 2016-09-18 DIAGNOSIS — F17210 Nicotine dependence, cigarettes, uncomplicated: Secondary | ICD-10-CM | POA: Diagnosis not present

## 2016-09-18 DIAGNOSIS — C139 Malignant neoplasm of hypopharynx, unspecified: Secondary | ICD-10-CM | POA: Diagnosis not present

## 2016-09-18 DIAGNOSIS — Z510 Encounter for antineoplastic radiation therapy: Secondary | ICD-10-CM | POA: Diagnosis not present

## 2016-09-19 DIAGNOSIS — Z510 Encounter for antineoplastic radiation therapy: Secondary | ICD-10-CM | POA: Diagnosis not present

## 2016-09-19 DIAGNOSIS — F17210 Nicotine dependence, cigarettes, uncomplicated: Secondary | ICD-10-CM | POA: Diagnosis not present

## 2016-09-19 DIAGNOSIS — C139 Malignant neoplasm of hypopharynx, unspecified: Secondary | ICD-10-CM | POA: Diagnosis not present

## 2016-09-20 DIAGNOSIS — Z510 Encounter for antineoplastic radiation therapy: Secondary | ICD-10-CM | POA: Diagnosis not present

## 2016-09-20 DIAGNOSIS — C139 Malignant neoplasm of hypopharynx, unspecified: Secondary | ICD-10-CM | POA: Diagnosis not present

## 2016-09-20 DIAGNOSIS — F17210 Nicotine dependence, cigarettes, uncomplicated: Secondary | ICD-10-CM | POA: Diagnosis not present

## 2016-09-23 DIAGNOSIS — Z510 Encounter for antineoplastic radiation therapy: Secondary | ICD-10-CM | POA: Diagnosis not present

## 2016-09-23 DIAGNOSIS — F17210 Nicotine dependence, cigarettes, uncomplicated: Secondary | ICD-10-CM | POA: Diagnosis not present

## 2016-09-23 DIAGNOSIS — C139 Malignant neoplasm of hypopharynx, unspecified: Secondary | ICD-10-CM | POA: Diagnosis not present

## 2016-09-24 DIAGNOSIS — Z510 Encounter for antineoplastic radiation therapy: Secondary | ICD-10-CM | POA: Diagnosis not present

## 2016-09-24 DIAGNOSIS — C138 Malignant neoplasm of overlapping sites of hypopharynx: Secondary | ICD-10-CM | POA: Diagnosis not present

## 2016-09-24 DIAGNOSIS — F17210 Nicotine dependence, cigarettes, uncomplicated: Secondary | ICD-10-CM | POA: Diagnosis not present

## 2016-09-24 DIAGNOSIS — C139 Malignant neoplasm of hypopharynx, unspecified: Secondary | ICD-10-CM | POA: Diagnosis not present

## 2016-09-25 DIAGNOSIS — C139 Malignant neoplasm of hypopharynx, unspecified: Secondary | ICD-10-CM | POA: Diagnosis not present

## 2016-09-25 DIAGNOSIS — F17210 Nicotine dependence, cigarettes, uncomplicated: Secondary | ICD-10-CM | POA: Diagnosis not present

## 2016-09-25 DIAGNOSIS — Z510 Encounter for antineoplastic radiation therapy: Secondary | ICD-10-CM | POA: Diagnosis not present

## 2016-09-26 DIAGNOSIS — Z510 Encounter for antineoplastic radiation therapy: Secondary | ICD-10-CM | POA: Diagnosis not present

## 2016-09-26 DIAGNOSIS — F17210 Nicotine dependence, cigarettes, uncomplicated: Secondary | ICD-10-CM | POA: Diagnosis not present

## 2016-09-26 DIAGNOSIS — C139 Malignant neoplasm of hypopharynx, unspecified: Secondary | ICD-10-CM | POA: Diagnosis not present

## 2016-09-27 DIAGNOSIS — Z510 Encounter for antineoplastic radiation therapy: Secondary | ICD-10-CM | POA: Diagnosis not present

## 2016-09-27 DIAGNOSIS — F17210 Nicotine dependence, cigarettes, uncomplicated: Secondary | ICD-10-CM | POA: Diagnosis not present

## 2016-09-27 DIAGNOSIS — C139 Malignant neoplasm of hypopharynx, unspecified: Secondary | ICD-10-CM | POA: Diagnosis not present

## 2016-09-29 DIAGNOSIS — F17290 Nicotine dependence, other tobacco product, uncomplicated: Secondary | ICD-10-CM | POA: Diagnosis not present

## 2016-09-29 DIAGNOSIS — Z79899 Other long term (current) drug therapy: Secondary | ICD-10-CM | POA: Diagnosis not present

## 2016-09-29 DIAGNOSIS — I82612 Acute embolism and thrombosis of superficial veins of left upper extremity: Secondary | ICD-10-CM | POA: Diagnosis not present

## 2016-09-29 DIAGNOSIS — I82611 Acute embolism and thrombosis of superficial veins of right upper extremity: Secondary | ICD-10-CM | POA: Diagnosis not present

## 2016-09-30 DIAGNOSIS — Z510 Encounter for antineoplastic radiation therapy: Secondary | ICD-10-CM | POA: Diagnosis not present

## 2016-09-30 DIAGNOSIS — F17210 Nicotine dependence, cigarettes, uncomplicated: Secondary | ICD-10-CM | POA: Diagnosis not present

## 2016-09-30 DIAGNOSIS — C139 Malignant neoplasm of hypopharynx, unspecified: Secondary | ICD-10-CM | POA: Diagnosis not present

## 2016-10-01 DIAGNOSIS — Z510 Encounter for antineoplastic radiation therapy: Secondary | ICD-10-CM | POA: Diagnosis not present

## 2016-10-01 DIAGNOSIS — C139 Malignant neoplasm of hypopharynx, unspecified: Secondary | ICD-10-CM | POA: Diagnosis not present

## 2016-10-01 DIAGNOSIS — F17210 Nicotine dependence, cigarettes, uncomplicated: Secondary | ICD-10-CM | POA: Diagnosis not present

## 2016-10-02 DIAGNOSIS — C139 Malignant neoplasm of hypopharynx, unspecified: Secondary | ICD-10-CM | POA: Diagnosis not present

## 2016-10-02 DIAGNOSIS — Z510 Encounter for antineoplastic radiation therapy: Secondary | ICD-10-CM | POA: Diagnosis not present

## 2016-10-02 DIAGNOSIS — F17210 Nicotine dependence, cigarettes, uncomplicated: Secondary | ICD-10-CM | POA: Diagnosis not present

## 2016-10-03 DIAGNOSIS — C139 Malignant neoplasm of hypopharynx, unspecified: Secondary | ICD-10-CM | POA: Diagnosis not present

## 2016-10-03 DIAGNOSIS — Z510 Encounter for antineoplastic radiation therapy: Secondary | ICD-10-CM | POA: Diagnosis not present

## 2016-10-03 DIAGNOSIS — F17210 Nicotine dependence, cigarettes, uncomplicated: Secondary | ICD-10-CM | POA: Diagnosis not present

## 2016-10-04 DIAGNOSIS — Z510 Encounter for antineoplastic radiation therapy: Secondary | ICD-10-CM | POA: Diagnosis not present

## 2016-10-04 DIAGNOSIS — F17210 Nicotine dependence, cigarettes, uncomplicated: Secondary | ICD-10-CM | POA: Diagnosis not present

## 2016-10-04 DIAGNOSIS — C139 Malignant neoplasm of hypopharynx, unspecified: Secondary | ICD-10-CM | POA: Diagnosis not present

## 2016-10-08 DIAGNOSIS — C770 Secondary and unspecified malignant neoplasm of lymph nodes of head, face and neck: Secondary | ICD-10-CM | POA: Diagnosis not present

## 2016-10-08 DIAGNOSIS — C138 Malignant neoplasm of overlapping sites of hypopharynx: Secondary | ICD-10-CM | POA: Diagnosis not present

## 2016-10-08 DIAGNOSIS — F17210 Nicotine dependence, cigarettes, uncomplicated: Secondary | ICD-10-CM | POA: Diagnosis not present

## 2016-10-08 DIAGNOSIS — Z510 Encounter for antineoplastic radiation therapy: Secondary | ICD-10-CM | POA: Diagnosis not present

## 2016-10-08 DIAGNOSIS — C139 Malignant neoplasm of hypopharynx, unspecified: Secondary | ICD-10-CM | POA: Diagnosis not present

## 2016-10-09 DIAGNOSIS — Z510 Encounter for antineoplastic radiation therapy: Secondary | ICD-10-CM | POA: Diagnosis not present

## 2016-10-09 DIAGNOSIS — C139 Malignant neoplasm of hypopharynx, unspecified: Secondary | ICD-10-CM | POA: Diagnosis not present

## 2016-10-09 DIAGNOSIS — F17210 Nicotine dependence, cigarettes, uncomplicated: Secondary | ICD-10-CM | POA: Diagnosis not present

## 2016-10-10 DIAGNOSIS — C139 Malignant neoplasm of hypopharynx, unspecified: Secondary | ICD-10-CM | POA: Diagnosis not present

## 2016-10-10 DIAGNOSIS — F17210 Nicotine dependence, cigarettes, uncomplicated: Secondary | ICD-10-CM | POA: Diagnosis not present

## 2016-10-10 DIAGNOSIS — Z510 Encounter for antineoplastic radiation therapy: Secondary | ICD-10-CM | POA: Diagnosis not present

## 2016-10-11 DIAGNOSIS — F17210 Nicotine dependence, cigarettes, uncomplicated: Secondary | ICD-10-CM | POA: Diagnosis not present

## 2016-10-11 DIAGNOSIS — Z510 Encounter for antineoplastic radiation therapy: Secondary | ICD-10-CM | POA: Diagnosis not present

## 2016-10-11 DIAGNOSIS — C139 Malignant neoplasm of hypopharynx, unspecified: Secondary | ICD-10-CM | POA: Diagnosis not present

## 2016-10-14 DIAGNOSIS — C139 Malignant neoplasm of hypopharynx, unspecified: Secondary | ICD-10-CM | POA: Diagnosis not present

## 2016-10-14 DIAGNOSIS — Z510 Encounter for antineoplastic radiation therapy: Secondary | ICD-10-CM | POA: Diagnosis not present

## 2016-10-14 DIAGNOSIS — F17210 Nicotine dependence, cigarettes, uncomplicated: Secondary | ICD-10-CM | POA: Diagnosis not present

## 2016-10-15 DIAGNOSIS — F17210 Nicotine dependence, cigarettes, uncomplicated: Secondary | ICD-10-CM | POA: Diagnosis not present

## 2016-10-15 DIAGNOSIS — Z510 Encounter for antineoplastic radiation therapy: Secondary | ICD-10-CM | POA: Diagnosis not present

## 2016-10-15 DIAGNOSIS — C139 Malignant neoplasm of hypopharynx, unspecified: Secondary | ICD-10-CM | POA: Diagnosis not present

## 2016-10-16 DIAGNOSIS — F17210 Nicotine dependence, cigarettes, uncomplicated: Secondary | ICD-10-CM | POA: Diagnosis not present

## 2016-10-16 DIAGNOSIS — Z510 Encounter for antineoplastic radiation therapy: Secondary | ICD-10-CM | POA: Diagnosis not present

## 2016-10-16 DIAGNOSIS — C139 Malignant neoplasm of hypopharynx, unspecified: Secondary | ICD-10-CM | POA: Diagnosis not present

## 2016-10-17 DIAGNOSIS — C139 Malignant neoplasm of hypopharynx, unspecified: Secondary | ICD-10-CM | POA: Diagnosis not present

## 2016-10-17 DIAGNOSIS — F17210 Nicotine dependence, cigarettes, uncomplicated: Secondary | ICD-10-CM | POA: Diagnosis not present

## 2016-10-17 DIAGNOSIS — Z510 Encounter for antineoplastic radiation therapy: Secondary | ICD-10-CM | POA: Diagnosis not present

## 2016-10-18 DIAGNOSIS — Z510 Encounter for antineoplastic radiation therapy: Secondary | ICD-10-CM | POA: Diagnosis not present

## 2016-10-18 DIAGNOSIS — F17210 Nicotine dependence, cigarettes, uncomplicated: Secondary | ICD-10-CM | POA: Diagnosis not present

## 2016-10-18 DIAGNOSIS — C139 Malignant neoplasm of hypopharynx, unspecified: Secondary | ICD-10-CM | POA: Diagnosis not present

## 2016-10-21 DIAGNOSIS — Z510 Encounter for antineoplastic radiation therapy: Secondary | ICD-10-CM | POA: Diagnosis not present

## 2016-10-21 DIAGNOSIS — F17210 Nicotine dependence, cigarettes, uncomplicated: Secondary | ICD-10-CM | POA: Diagnosis not present

## 2016-10-21 DIAGNOSIS — C139 Malignant neoplasm of hypopharynx, unspecified: Secondary | ICD-10-CM | POA: Diagnosis not present

## 2016-10-22 DIAGNOSIS — Z510 Encounter for antineoplastic radiation therapy: Secondary | ICD-10-CM | POA: Diagnosis not present

## 2016-10-22 DIAGNOSIS — F17210 Nicotine dependence, cigarettes, uncomplicated: Secondary | ICD-10-CM | POA: Diagnosis not present

## 2016-10-22 DIAGNOSIS — C139 Malignant neoplasm of hypopharynx, unspecified: Secondary | ICD-10-CM | POA: Diagnosis not present

## 2016-10-23 DIAGNOSIS — C139 Malignant neoplasm of hypopharynx, unspecified: Secondary | ICD-10-CM | POA: Diagnosis not present

## 2016-10-23 DIAGNOSIS — F17210 Nicotine dependence, cigarettes, uncomplicated: Secondary | ICD-10-CM | POA: Diagnosis not present

## 2016-10-23 DIAGNOSIS — Z510 Encounter for antineoplastic radiation therapy: Secondary | ICD-10-CM | POA: Diagnosis not present

## 2016-10-24 DIAGNOSIS — C138 Malignant neoplasm of overlapping sites of hypopharynx: Secondary | ICD-10-CM | POA: Diagnosis not present

## 2016-10-24 DIAGNOSIS — C139 Malignant neoplasm of hypopharynx, unspecified: Secondary | ICD-10-CM | POA: Diagnosis not present

## 2016-10-24 DIAGNOSIS — Z510 Encounter for antineoplastic radiation therapy: Secondary | ICD-10-CM | POA: Diagnosis not present

## 2016-10-24 DIAGNOSIS — F17210 Nicotine dependence, cigarettes, uncomplicated: Secondary | ICD-10-CM | POA: Diagnosis not present

## 2016-10-25 DIAGNOSIS — F17210 Nicotine dependence, cigarettes, uncomplicated: Secondary | ICD-10-CM | POA: Diagnosis not present

## 2016-10-25 DIAGNOSIS — C139 Malignant neoplasm of hypopharynx, unspecified: Secondary | ICD-10-CM | POA: Diagnosis not present

## 2016-10-25 DIAGNOSIS — Z510 Encounter for antineoplastic radiation therapy: Secondary | ICD-10-CM | POA: Diagnosis not present

## 2016-10-28 DIAGNOSIS — F17210 Nicotine dependence, cigarettes, uncomplicated: Secondary | ICD-10-CM | POA: Diagnosis not present

## 2016-10-28 DIAGNOSIS — Z510 Encounter for antineoplastic radiation therapy: Secondary | ICD-10-CM | POA: Diagnosis not present

## 2016-10-28 DIAGNOSIS — C139 Malignant neoplasm of hypopharynx, unspecified: Secondary | ICD-10-CM | POA: Diagnosis not present

## 2016-10-29 DIAGNOSIS — C139 Malignant neoplasm of hypopharynx, unspecified: Secondary | ICD-10-CM | POA: Diagnosis not present

## 2016-10-29 DIAGNOSIS — Z510 Encounter for antineoplastic radiation therapy: Secondary | ICD-10-CM | POA: Diagnosis not present

## 2016-10-29 DIAGNOSIS — F17210 Nicotine dependence, cigarettes, uncomplicated: Secondary | ICD-10-CM | POA: Diagnosis not present

## 2016-10-29 DIAGNOSIS — C138 Malignant neoplasm of overlapping sites of hypopharynx: Secondary | ICD-10-CM | POA: Diagnosis not present

## 2016-10-30 DIAGNOSIS — C139 Malignant neoplasm of hypopharynx, unspecified: Secondary | ICD-10-CM | POA: Diagnosis not present

## 2016-10-30 DIAGNOSIS — F17210 Nicotine dependence, cigarettes, uncomplicated: Secondary | ICD-10-CM | POA: Diagnosis not present

## 2016-10-30 DIAGNOSIS — Z510 Encounter for antineoplastic radiation therapy: Secondary | ICD-10-CM | POA: Diagnosis not present

## 2016-10-31 DIAGNOSIS — C139 Malignant neoplasm of hypopharynx, unspecified: Secondary | ICD-10-CM | POA: Diagnosis not present

## 2016-10-31 DIAGNOSIS — Z510 Encounter for antineoplastic radiation therapy: Secondary | ICD-10-CM | POA: Diagnosis not present

## 2016-10-31 DIAGNOSIS — F17210 Nicotine dependence, cigarettes, uncomplicated: Secondary | ICD-10-CM | POA: Diagnosis not present

## 2016-11-01 DIAGNOSIS — F17210 Nicotine dependence, cigarettes, uncomplicated: Secondary | ICD-10-CM | POA: Diagnosis not present

## 2016-11-01 DIAGNOSIS — Z510 Encounter for antineoplastic radiation therapy: Secondary | ICD-10-CM | POA: Diagnosis not present

## 2016-11-01 DIAGNOSIS — C139 Malignant neoplasm of hypopharynx, unspecified: Secondary | ICD-10-CM | POA: Diagnosis not present

## 2016-11-04 DIAGNOSIS — Z510 Encounter for antineoplastic radiation therapy: Secondary | ICD-10-CM | POA: Diagnosis not present

## 2016-11-04 DIAGNOSIS — C139 Malignant neoplasm of hypopharynx, unspecified: Secondary | ICD-10-CM | POA: Diagnosis not present

## 2016-11-04 DIAGNOSIS — F17210 Nicotine dependence, cigarettes, uncomplicated: Secondary | ICD-10-CM | POA: Diagnosis not present

## 2016-11-05 ENCOUNTER — Other Ambulatory Visit: Payer: Self-pay | Admitting: Internal Medicine

## 2016-11-05 DIAGNOSIS — F17210 Nicotine dependence, cigarettes, uncomplicated: Secondary | ICD-10-CM | POA: Diagnosis not present

## 2016-11-05 DIAGNOSIS — Z510 Encounter for antineoplastic radiation therapy: Secondary | ICD-10-CM | POA: Diagnosis not present

## 2016-11-05 DIAGNOSIS — C138 Malignant neoplasm of overlapping sites of hypopharynx: Secondary | ICD-10-CM | POA: Diagnosis not present

## 2016-11-05 DIAGNOSIS — C139 Malignant neoplasm of hypopharynx, unspecified: Secondary | ICD-10-CM | POA: Diagnosis not present

## 2016-11-05 NOTE — Telephone Encounter (Signed)
Medication Detail    Disp Refills Start End   metoprolol tartrate (LOPRESSOR) 25 MG tablet 180 tablet 3 03/22/2016    Sig - Route: Take 1 tablet (25 mg total) by mouth 2 (two) times daily. - Oral   E-Prescribing Status: Receipt confirmed by pharmacy (03/22/2016 10:06 AM EST)   Pharmacy   [id number], [place]

## 2016-11-10 DIAGNOSIS — C138 Malignant neoplasm of overlapping sites of hypopharynx: Secondary | ICD-10-CM | POA: Diagnosis not present

## 2016-11-22 DIAGNOSIS — I480 Paroxysmal atrial fibrillation: Secondary | ICD-10-CM | POA: Diagnosis not present

## 2016-11-22 DIAGNOSIS — E860 Dehydration: Secondary | ICD-10-CM | POA: Diagnosis not present

## 2016-11-22 DIAGNOSIS — Z7901 Long term (current) use of anticoagulants: Secondary | ICD-10-CM | POA: Diagnosis not present

## 2016-11-22 DIAGNOSIS — R079 Chest pain, unspecified: Secondary | ICD-10-CM | POA: Diagnosis not present

## 2016-11-22 DIAGNOSIS — E861 Hypovolemia: Secondary | ICD-10-CM | POA: Diagnosis not present

## 2016-11-22 DIAGNOSIS — I4891 Unspecified atrial fibrillation: Secondary | ICD-10-CM | POA: Diagnosis not present

## 2016-11-22 DIAGNOSIS — C770 Secondary and unspecified malignant neoplasm of lymph nodes of head, face and neck: Secondary | ICD-10-CM | POA: Diagnosis not present

## 2016-11-22 DIAGNOSIS — K5900 Constipation, unspecified: Secondary | ICD-10-CM | POA: Diagnosis not present

## 2016-11-22 DIAGNOSIS — Z931 Gastrostomy status: Secondary | ICD-10-CM | POA: Diagnosis not present

## 2016-11-22 DIAGNOSIS — Z79899 Other long term (current) drug therapy: Secondary | ICD-10-CM | POA: Diagnosis not present

## 2016-11-22 DIAGNOSIS — R42 Dizziness and giddiness: Secondary | ICD-10-CM | POA: Diagnosis not present

## 2016-12-03 DIAGNOSIS — C139 Malignant neoplasm of hypopharynx, unspecified: Secondary | ICD-10-CM | POA: Diagnosis not present

## 2016-12-03 DIAGNOSIS — C138 Malignant neoplasm of overlapping sites of hypopharynx: Secondary | ICD-10-CM | POA: Diagnosis not present

## 2016-12-03 DIAGNOSIS — C770 Secondary and unspecified malignant neoplasm of lymph nodes of head, face and neck: Secondary | ICD-10-CM | POA: Diagnosis not present

## 2016-12-03 DIAGNOSIS — D649 Anemia, unspecified: Secondary | ICD-10-CM | POA: Diagnosis not present

## 2016-12-23 DIAGNOSIS — E538 Deficiency of other specified B group vitamins: Secondary | ICD-10-CM | POA: Diagnosis not present

## 2016-12-23 DIAGNOSIS — C770 Secondary and unspecified malignant neoplasm of lymph nodes of head, face and neck: Secondary | ICD-10-CM | POA: Diagnosis not present

## 2016-12-23 DIAGNOSIS — Z125 Encounter for screening for malignant neoplasm of prostate: Secondary | ICD-10-CM | POA: Diagnosis not present

## 2016-12-23 DIAGNOSIS — Z0000 Encounter for general adult medical examination without abnormal findings: Secondary | ICD-10-CM | POA: Diagnosis not present

## 2016-12-23 DIAGNOSIS — I10 Essential (primary) hypertension: Secondary | ICD-10-CM | POA: Diagnosis not present

## 2016-12-23 DIAGNOSIS — E291 Testicular hypofunction: Secondary | ICD-10-CM | POA: Diagnosis not present

## 2016-12-23 DIAGNOSIS — E784 Other hyperlipidemia: Secondary | ICD-10-CM | POA: Diagnosis not present

## 2017-01-23 DIAGNOSIS — C140 Malignant neoplasm of pharynx, unspecified: Secondary | ICD-10-CM | POA: Diagnosis not present

## 2017-01-23 DIAGNOSIS — C770 Secondary and unspecified malignant neoplasm of lymph nodes of head, face and neck: Secondary | ICD-10-CM | POA: Diagnosis not present

## 2017-01-28 DIAGNOSIS — C138 Malignant neoplasm of overlapping sites of hypopharynx: Secondary | ICD-10-CM | POA: Diagnosis not present

## 2017-01-28 DIAGNOSIS — C139 Malignant neoplasm of hypopharynx, unspecified: Secondary | ICD-10-CM | POA: Diagnosis not present

## 2017-01-30 DIAGNOSIS — C138 Malignant neoplasm of overlapping sites of hypopharynx: Secondary | ICD-10-CM | POA: Diagnosis not present

## 2017-02-10 DIAGNOSIS — C138 Malignant neoplasm of overlapping sites of hypopharynx: Secondary | ICD-10-CM | POA: Diagnosis not present

## 2017-02-19 DIAGNOSIS — Z79899 Other long term (current) drug therapy: Secondary | ICD-10-CM | POA: Diagnosis not present

## 2017-02-19 DIAGNOSIS — Z7901 Long term (current) use of anticoagulants: Secondary | ICD-10-CM | POA: Diagnosis not present

## 2017-02-19 DIAGNOSIS — C770 Secondary and unspecified malignant neoplasm of lymph nodes of head, face and neck: Secondary | ICD-10-CM | POA: Diagnosis not present

## 2017-02-19 DIAGNOSIS — C138 Malignant neoplasm of overlapping sites of hypopharynx: Secondary | ICD-10-CM | POA: Diagnosis not present

## 2017-02-19 DIAGNOSIS — K219 Gastro-esophageal reflux disease without esophagitis: Secondary | ICD-10-CM | POA: Diagnosis not present

## 2017-02-19 DIAGNOSIS — R490 Dysphonia: Secondary | ICD-10-CM | POA: Diagnosis not present

## 2017-02-19 DIAGNOSIS — Z87891 Personal history of nicotine dependence: Secondary | ICD-10-CM | POA: Diagnosis not present

## 2017-02-19 DIAGNOSIS — C139 Malignant neoplasm of hypopharynx, unspecified: Secondary | ICD-10-CM | POA: Diagnosis not present

## 2017-02-19 DIAGNOSIS — I10 Essential (primary) hypertension: Secondary | ICD-10-CM | POA: Diagnosis not present

## 2017-02-19 DIAGNOSIS — J312 Chronic pharyngitis: Secondary | ICD-10-CM | POA: Diagnosis not present

## 2017-02-19 DIAGNOSIS — I480 Paroxysmal atrial fibrillation: Secondary | ICD-10-CM | POA: Diagnosis not present

## 2017-02-19 DIAGNOSIS — Z931 Gastrostomy status: Secondary | ICD-10-CM | POA: Diagnosis not present

## 2017-03-12 DIAGNOSIS — C138 Malignant neoplasm of overlapping sites of hypopharynx: Secondary | ICD-10-CM | POA: Diagnosis not present

## 2017-03-13 DIAGNOSIS — C138 Malignant neoplasm of overlapping sites of hypopharynx: Secondary | ICD-10-CM | POA: Diagnosis not present

## 2017-03-25 DIAGNOSIS — C139 Malignant neoplasm of hypopharynx, unspecified: Secondary | ICD-10-CM | POA: Diagnosis not present

## 2017-04-09 DIAGNOSIS — F17210 Nicotine dependence, cigarettes, uncomplicated: Secondary | ICD-10-CM | POA: Diagnosis not present

## 2017-04-09 DIAGNOSIS — C139 Malignant neoplasm of hypopharynx, unspecified: Secondary | ICD-10-CM | POA: Diagnosis not present

## 2017-04-22 DIAGNOSIS — C138 Malignant neoplasm of overlapping sites of hypopharynx: Secondary | ICD-10-CM | POA: Diagnosis not present

## 2017-04-23 ENCOUNTER — Ambulatory Visit (INDEPENDENT_AMBULATORY_CARE_PROVIDER_SITE_OTHER): Payer: Medicare Other | Admitting: Internal Medicine

## 2017-04-23 ENCOUNTER — Encounter: Payer: Self-pay | Admitting: Internal Medicine

## 2017-04-23 VITALS — BP 100/56 | HR 74 | Ht 74.0 in | Wt 191.8 lb

## 2017-04-23 DIAGNOSIS — I10 Essential (primary) hypertension: Secondary | ICD-10-CM | POA: Diagnosis not present

## 2017-04-23 DIAGNOSIS — I480 Paroxysmal atrial fibrillation: Secondary | ICD-10-CM | POA: Diagnosis not present

## 2017-04-23 DIAGNOSIS — C138 Malignant neoplasm of overlapping sites of hypopharynx: Secondary | ICD-10-CM | POA: Diagnosis not present

## 2017-04-23 MED ORDER — APIXABAN 5 MG PO TABS: 5.0000 mg | ORAL_TABLET | Freq: Two times a day (BID) | ORAL | 11 refills | Status: DC

## 2017-04-23 NOTE — Progress Notes (Signed)
HPI Nathan Neal returns today for ongoing follow-up of his paroxysmal atrial fibrillation.  He is a 71 year old man who has a history of atrial fibrillation dating back 15 years.  He has been well controlled with medical therapy using flecainide.  Over the past 2-3 years, he has had no sustained episodes of atrial fibrillation and has not required hospitalization.  He does have hypertension and has been on systemic anticoagulation until recently.  The patient was diagnosed with oral cancer and has undergone extensive treatment with radiation and chemotherapy.  He has 2 use a gastric tube to receive nutrition as he can only swallow liquids and not well since his treatment.  He is still hoarse.  He has minimal palpitations.  He denies chest pain or shortness of breath. No Known Allergies   Current Outpatient Medications  Medication Sig Dispense Refill  . allopurinol (ZYLOPRIM) 100 MG tablet Take 100 mg by mouth daily.  0  . ALPRAZolam (XANAX) 0.5 MG tablet Take 0.25 mg by mouth at bedtime as needed for sleep.    . ARIPiprazole (ABILIFY PO) Take 1 tablet by mouth daily. PATIENT CAN' T REMEMBER DOSAGE    . atorvastatin (LIPITOR) 10 MG tablet Take 10 mg by mouth daily.      Marland Kitchen escitalopram (LEXAPRO) 20 MG tablet Take 20 mg by mouth daily.    . flecainide (TAMBOCOR) 150 MG tablet Take 1 tablet (150 mg total) by mouth 2 (two) times daily. 180 tablet 3  . metoprolol tartrate (LOPRESSOR) 25 MG tablet Take 1 tablet (25 mg total) by mouth 2 (two) times daily. 180 tablet 3  . zolpidem (AMBIEN CR) 12.5 MG CR tablet Take 12.5 mg by mouth daily.   0  . apixaban (ELIQUIS) 5 MG TABS tablet Take 1 tablet (5 mg total) by mouth 2 (two) times daily. 60 tablet 11   No current facility-administered medications for this visit.      Past Medical History:  Diagnosis Date  . HTN (hypertension)   . PAF (paroxysmal atrial fibrillation) (HCC)     ROS:   All systems reviewed and negative except as noted in  the HPI.   No past surgical history on file.   Family History  Problem Relation Age of Onset  . Atrial fibrillation Mother   . Atrial fibrillation Father      Social History   Socioeconomic History  . Marital status: Married    Spouse name: Not on file  . Number of children: Not on file  . Years of education: Not on file  . Highest education level: Not on file  Social Needs  . Financial resource strain: Not on file  . Food insecurity - worry: Not on file  . Food insecurity - inability: Not on file  . Transportation needs - medical: Not on file  . Transportation needs - non-medical: Not on file  Occupational History  . Not on file  Tobacco Use  . Smoking status: Never Smoker  . Smokeless tobacco: Never Used  Substance and Sexual Activity  . Alcohol use: Not on file  . Drug use: Not on file  . Sexual activity: Not on file  Other Topics Concern  . Not on file  Social History Narrative  . Not on file     BP (!) 100/56   Pulse 74   Ht 6\' 2"  (1.88 m)   Wt 191 lb 12.8 oz (87 kg)   SpO2 98%   BMI 24.63 kg/m  Physical Exam:  Stable appearing 71 year old man, NAD HEENT: Unremarkable Neck: 6 cm JVD, no thyromegally Lymphatics:  No adenopathy Back:  No CVA tenderness Lungs:  Clear, with scattered rales in the bases.  There are upper airway sounds in the middle upper chest HEART:  Regular rate rhythm, no murmurs, no rubs, no clicks Abd:  soft, positive bowel sounds, no organomegally, no rebound, no guarding Ext:  2 plus pulses, no edema, no cyanosis, no clubbing Skin:  No rashes no nodules Neuro:  CN II through XII intact, motor grossly intact  EKG -sinus rhythm with first-degree AV block, QRS duration 96  Assess/Plan: 1.  Paroxysmal atrial fibrillation -he has been on Pradaxa for many years but can no longer take it secondary to his feeding tube.  I just started the patient on Eliquis.  He will continue flecainide. 2.  Hypertension -his blood pressure is on  the low side today and he has had some dizziness.  However he notes that in [place] where he lives, his pressures are in the 140-150 range.  For this reason we will continue his beta-blocker. 3.  Systemic anticoagulation -he has been switched to Eliquis.  He is instructed to stop taking Pradaxa.  Nathan Sickles, MD

## 2017-04-23 NOTE — Patient Instructions (Addendum)
Medication Instructions:  Your physician has recommended you make the following change in your medication:  1.  Start taking Eliquis 5 mg (one tablet) by mouth twice a day.  Labwork: None ordered.  Testing/Procedures: None ordered.  Follow-Up: Your physician wants you to follow-up in: one year with Dr. Lovena Le.   You will receive a reminder letter in the mail two months in advance. If you don't receive a letter, please call our office to schedule the follow-up appointment.  Any Other Special Instructions Will Be Listed Below (If Applicable).   If you need a refill on your cardiac medications before your next appointment, please call your pharmacy.  Apixaban oral tablets What is this medicine? APIXABAN (a PIX a ban) is an anticoagulant (blood thinner). It is used to lower the chance of stroke in people with a medical condition called atrial fibrillation. It is also used to treat or prevent blood clots in the lungs or in the veins. This medicine may be used for other purposes; ask your health care provider or pharmacist if you have questions. COMMON BRAND NAME(S): Eliquis What should I tell my health care provider before I take this medicine? They need to know if you have any of these conditions: -bleeding disorders -bleeding in the brain -blood in your stools (black or tarry stools) or if you have blood in your vomit -history of stomach bleeding -kidney disease -liver disease -mechanical heart valve -an unusual or allergic reaction to apixaban, other medicines, foods, dyes, or preservatives -pregnant or trying to get pregnant -breast-feeding How should I use this medicine? Take this medicine by mouth with a glass of water. Follow the directions on the prescription label. You can take it with or without food. If it upsets your stomach, take it with food. Take your medicine at regular intervals. Do not take it more often than directed. Do not stop taking except on your doctor's  advice. Stopping this medicine may increase your risk of a blot clot. Be sure to refill your prescription before you run out of medicine. Talk to your pediatrician regarding the use of this medicine in children. Special care may be needed. Overdosage: If you think you have taken too much of this medicine contact a poison control center or emergency room at once. NOTE: This medicine is only for you. Do not share this medicine with others. What if I miss a dose? If you miss a dose, take it as soon as you can. If it is almost time for your next dose, take only that dose. Do not take double or extra doses. What may interact with this medicine? This medicine may interact with the following: -aspirin and aspirin-like medicines -certain medicines for fungal infections like ketoconazole and itraconazole -certain medicines for seizures like carbamazepine and phenytoin -certain medicines that treat or prevent blood clots like warfarin, enoxaparin, and dalteparin -clarithromycin -NSAIDs, medicines for pain and inflammation, like ibuprofen or naproxen -rifampin -ritonavir -St. John's wort This list may not describe all possible interactions. Give your health care provider a list of all the medicines, herbs, non-prescription drugs, or dietary supplements you use. Also tell them if you smoke, drink alcohol, or use illegal drugs. Some items may interact with your medicine. What should I watch for while using this medicine? Visit your doctor or health care professional for regular checks on your progress. Notify your doctor or health care professional and seek emergency treatment if you develop breathing problems; changes in vision; chest pain; severe, sudden headache; pain, swelling,  warmth in the leg; trouble speaking; sudden numbness or weakness of the face, arm or leg. These can be signs that your condition has gotten worse. If you are going to have surgery or other procedure, tell your doctor that you are  taking this medicine. What side effects may I notice from receiving this medicine? Side effects that you should report to your doctor or health care professional as soon as possible: -allergic reactions like skin rash, itching or hives, swelling of the face, lips, or tongue -signs and symptoms of bleeding such as bloody or black, tarry stools; red or dark-brown urine; spitting up blood or brown material that looks like coffee grounds; red spots on the skin; unusual bruising or bleeding from the eye, gums, or nose This list may not describe all possible side effects. Call your doctor for medical advice about side effects. You may report side effects to FDA at 1-800-FDA-1088. Where should I keep my medicine? Keep out of the reach of children. Store at room temperature between 20 and 25 degrees C (68 and 77 degrees F). Throw away any unused medicine after the expiration date. NOTE: This sheet is a summary. It may not cover all possible information. If you have questions about this medicine, talk to your doctor, pharmacist, or health care provider.  2018 Elsevier/Gold Standard (2015-11-20 11:54:23)

## 2017-05-09 DIAGNOSIS — C760 Malignant neoplasm of head, face and neck: Secondary | ICD-10-CM | POA: Diagnosis not present

## 2017-05-13 DIAGNOSIS — C138 Malignant neoplasm of overlapping sites of hypopharynx: Secondary | ICD-10-CM | POA: Diagnosis not present

## 2017-05-16 DIAGNOSIS — C139 Malignant neoplasm of hypopharynx, unspecified: Secondary | ICD-10-CM | POA: Diagnosis not present

## 2017-05-30 ENCOUNTER — Other Ambulatory Visit: Payer: Self-pay | Admitting: *Deleted

## 2017-05-30 MED ORDER — METOPROLOL TARTRATE 25 MG PO TABS: 25.0000 mg | ORAL_TABLET | Freq: Two times a day (BID) | ORAL | 2 refills | Status: DC

## 2017-05-30 MED ORDER — FLECAINIDE ACETATE 150 MG PO TABS: 150.0000 mg | ORAL_TABLET | Freq: Two times a day (BID) | ORAL | 2 refills | Status: DC

## 2017-06-03 DIAGNOSIS — C770 Secondary and unspecified malignant neoplasm of lymph nodes of head, face and neck: Secondary | ICD-10-CM | POA: Diagnosis not present

## 2017-06-03 DIAGNOSIS — C138 Malignant neoplasm of overlapping sites of hypopharynx: Secondary | ICD-10-CM | POA: Diagnosis not present

## 2017-06-12 DIAGNOSIS — C138 Malignant neoplasm of overlapping sites of hypopharynx: Secondary | ICD-10-CM | POA: Diagnosis not present

## 2017-06-24 DIAGNOSIS — Z931 Gastrostomy status: Secondary | ICD-10-CM | POA: Diagnosis not present

## 2017-06-24 DIAGNOSIS — R7302 Impaired glucose tolerance (oral): Secondary | ICD-10-CM | POA: Diagnosis not present

## 2017-06-24 DIAGNOSIS — C770 Secondary and unspecified malignant neoplasm of lymph nodes of head, face and neck: Secondary | ICD-10-CM | POA: Diagnosis not present

## 2017-06-24 DIAGNOSIS — F338 Other recurrent depressive disorders: Secondary | ICD-10-CM | POA: Diagnosis not present

## 2017-07-15 DIAGNOSIS — C138 Malignant neoplasm of overlapping sites of hypopharynx: Secondary | ICD-10-CM | POA: Diagnosis not present

## 2017-07-15 DIAGNOSIS — C770 Secondary and unspecified malignant neoplasm of lymph nodes of head, face and neck: Secondary | ICD-10-CM | POA: Diagnosis not present

## 2017-07-15 DIAGNOSIS — Z923 Personal history of irradiation: Secondary | ICD-10-CM | POA: Diagnosis not present

## 2017-07-16 DIAGNOSIS — C138 Malignant neoplasm of overlapping sites of hypopharynx: Secondary | ICD-10-CM | POA: Diagnosis not present

## 2017-08-25 DIAGNOSIS — C138 Malignant neoplasm of overlapping sites of hypopharynx: Secondary | ICD-10-CM | POA: Diagnosis not present

## 2017-09-02 DIAGNOSIS — M8708 Idiopathic aseptic necrosis of bone, other site: Secondary | ICD-10-CM | POA: Diagnosis not present

## 2017-09-02 DIAGNOSIS — C138 Malignant neoplasm of overlapping sites of hypopharynx: Secondary | ICD-10-CM | POA: Diagnosis not present

## 2017-09-02 DIAGNOSIS — C770 Secondary and unspecified malignant neoplasm of lymph nodes of head, face and neck: Secondary | ICD-10-CM | POA: Diagnosis not present

## 2017-09-10 DIAGNOSIS — C138 Malignant neoplasm of overlapping sites of hypopharynx: Secondary | ICD-10-CM | POA: Diagnosis not present

## 2017-10-16 DIAGNOSIS — C139 Malignant neoplasm of hypopharynx, unspecified: Secondary | ICD-10-CM | POA: Diagnosis not present

## 2017-10-16 DIAGNOSIS — C138 Malignant neoplasm of overlapping sites of hypopharynx: Secondary | ICD-10-CM | POA: Diagnosis not present

## 2017-10-16 DIAGNOSIS — C770 Secondary and unspecified malignant neoplasm of lymph nodes of head, face and neck: Secondary | ICD-10-CM | POA: Diagnosis not present

## 2017-10-21 DIAGNOSIS — C138 Malignant neoplasm of overlapping sites of hypopharynx: Secondary | ICD-10-CM | POA: Diagnosis not present

## 2017-11-05 DIAGNOSIS — Z8547 Personal history of malignant neoplasm of testis: Secondary | ICD-10-CM | POA: Diagnosis not present

## 2017-11-05 DIAGNOSIS — C139 Malignant neoplasm of hypopharynx, unspecified: Secondary | ICD-10-CM | POA: Diagnosis not present

## 2017-11-05 DIAGNOSIS — K219 Gastro-esophageal reflux disease without esophagitis: Secondary | ICD-10-CM | POA: Diagnosis not present

## 2017-11-05 DIAGNOSIS — Z7901 Long term (current) use of anticoagulants: Secondary | ICD-10-CM | POA: Diagnosis not present

## 2017-11-05 DIAGNOSIS — Z87891 Personal history of nicotine dependence: Secondary | ICD-10-CM | POA: Diagnosis not present

## 2017-11-05 DIAGNOSIS — Z431 Encounter for attention to gastrostomy: Secondary | ICD-10-CM | POA: Diagnosis not present

## 2017-11-05 DIAGNOSIS — Z9079 Acquired absence of other genital organ(s): Secondary | ICD-10-CM | POA: Diagnosis not present

## 2017-11-05 DIAGNOSIS — K222 Esophageal obstruction: Secondary | ICD-10-CM | POA: Diagnosis not present

## 2017-11-07 DIAGNOSIS — C138 Malignant neoplasm of overlapping sites of hypopharynx: Secondary | ICD-10-CM | POA: Diagnosis not present

## 2017-11-10 DIAGNOSIS — C138 Malignant neoplasm of overlapping sites of hypopharynx: Secondary | ICD-10-CM | POA: Diagnosis not present

## 2017-12-19 DIAGNOSIS — R1310 Dysphagia, unspecified: Secondary | ICD-10-CM | POA: Diagnosis not present

## 2017-12-19 DIAGNOSIS — C760 Malignant neoplasm of head, face and neck: Secondary | ICD-10-CM | POA: Diagnosis not present

## 2017-12-19 DIAGNOSIS — T17398A Other foreign object in larynx causing other injury, initial encounter: Secondary | ICD-10-CM | POA: Diagnosis not present

## 2017-12-24 DIAGNOSIS — Z923 Personal history of irradiation: Secondary | ICD-10-CM | POA: Diagnosis not present

## 2017-12-24 DIAGNOSIS — Z7901 Long term (current) use of anticoagulants: Secondary | ICD-10-CM | POA: Diagnosis not present

## 2017-12-24 DIAGNOSIS — Z9221 Personal history of antineoplastic chemotherapy: Secondary | ICD-10-CM | POA: Diagnosis not present

## 2017-12-24 DIAGNOSIS — C770 Secondary and unspecified malignant neoplasm of lymph nodes of head, face and neck: Secondary | ICD-10-CM | POA: Diagnosis not present

## 2017-12-24 DIAGNOSIS — Z79899 Other long term (current) drug therapy: Secondary | ICD-10-CM | POA: Diagnosis not present

## 2017-12-24 DIAGNOSIS — K222 Esophageal obstruction: Secondary | ICD-10-CM | POA: Diagnosis not present

## 2017-12-24 DIAGNOSIS — I480 Paroxysmal atrial fibrillation: Secondary | ICD-10-CM | POA: Diagnosis not present

## 2017-12-24 DIAGNOSIS — C139 Malignant neoplasm of hypopharynx, unspecified: Secondary | ICD-10-CM | POA: Diagnosis not present

## 2017-12-24 DIAGNOSIS — Z87891 Personal history of nicotine dependence: Secondary | ICD-10-CM | POA: Diagnosis not present

## 2017-12-24 DIAGNOSIS — Z86718 Personal history of other venous thrombosis and embolism: Secondary | ICD-10-CM | POA: Diagnosis not present

## 2018-01-02 DIAGNOSIS — K222 Esophageal obstruction: Secondary | ICD-10-CM | POA: Diagnosis not present

## 2018-01-02 DIAGNOSIS — C138 Malignant neoplasm of overlapping sites of hypopharynx: Secondary | ICD-10-CM | POA: Diagnosis not present

## 2018-01-15 DIAGNOSIS — E7849 Other hyperlipidemia: Secondary | ICD-10-CM | POA: Diagnosis not present

## 2018-01-15 DIAGNOSIS — R82998 Other abnormal findings in urine: Secondary | ICD-10-CM | POA: Diagnosis not present

## 2018-01-15 DIAGNOSIS — I10 Essential (primary) hypertension: Secondary | ICD-10-CM | POA: Diagnosis not present

## 2018-01-15 DIAGNOSIS — E538 Deficiency of other specified B group vitamins: Secondary | ICD-10-CM | POA: Diagnosis not present

## 2018-01-15 DIAGNOSIS — Z0000 Encounter for general adult medical examination without abnormal findings: Secondary | ICD-10-CM | POA: Diagnosis not present

## 2018-01-15 DIAGNOSIS — E291 Testicular hypofunction: Secondary | ICD-10-CM | POA: Diagnosis not present

## 2018-02-20 DIAGNOSIS — K222 Esophageal obstruction: Secondary | ICD-10-CM | POA: Diagnosis not present

## 2018-02-20 DIAGNOSIS — C138 Malignant neoplasm of overlapping sites of hypopharynx: Secondary | ICD-10-CM | POA: Diagnosis not present

## 2018-02-20 DIAGNOSIS — C139 Malignant neoplasm of hypopharynx, unspecified: Secondary | ICD-10-CM | POA: Diagnosis not present

## 2018-03-23 ENCOUNTER — Other Ambulatory Visit: Payer: Self-pay | Admitting: *Deleted

## 2018-03-23 ENCOUNTER — Telehealth: Payer: Self-pay | Admitting: Internal Medicine

## 2018-03-23 MED ORDER — FLECAINIDE ACETATE 150 MG PO TABS: 150.0000 mg | ORAL_TABLET | Freq: Two times a day (BID) | ORAL | 0 refills | Status: DC

## 2018-03-23 NOTE — Telephone Encounter (Signed)
New Message   Betsy from [facility] is calling, states she has been faxing a request for refill since last week with no response.   *STAT* If patient is at the pharmacy, call can be transferred to refill team.   1. Which medications need to be refilled? (please list name of each medication and dose if known) flecainide (TAMBOCOR) 150 MG tablet  2. Which pharmacy/location (including street and city if local pharmacy) is medication to be sent to? [phone] - [place], [place] AT [address]  3. Do they need a 30 day or 90 day supply? [place]

## 2018-05-22 DIAGNOSIS — K222 Esophageal obstruction: Secondary | ICD-10-CM | POA: Diagnosis not present

## 2018-05-22 DIAGNOSIS — C138 Malignant neoplasm of overlapping sites of hypopharynx: Secondary | ICD-10-CM | POA: Diagnosis not present

## 2018-05-22 DIAGNOSIS — R197 Diarrhea, unspecified: Secondary | ICD-10-CM | POA: Diagnosis not present

## 2018-05-22 DIAGNOSIS — I4891 Unspecified atrial fibrillation: Secondary | ICD-10-CM | POA: Diagnosis not present

## 2018-05-25 ENCOUNTER — Ambulatory Visit: Payer: Medicare Other | Admitting: Internal Medicine

## 2018-05-25 ENCOUNTER — Encounter: Payer: Self-pay | Admitting: Internal Medicine

## 2018-05-25 VITALS — BP 116/66 | HR 85 | Ht 74.0 in | Wt 206.2 lb

## 2018-05-25 DIAGNOSIS — I480 Paroxysmal atrial fibrillation: Secondary | ICD-10-CM

## 2018-05-25 DIAGNOSIS — I10 Essential (primary) hypertension: Secondary | ICD-10-CM

## 2018-05-25 MED ORDER — APIXABAN 5 MG PO TABS: 5.0000 mg | ORAL_TABLET | Freq: Two times a day (BID) | ORAL | 11 refills | Status: AC

## 2018-05-25 MED ORDER — FLECAINIDE ACETATE 150 MG PO TABS: 150.0000 mg | ORAL_TABLET | Freq: Two times a day (BID) | ORAL | 3 refills | Status: DC

## 2018-05-25 MED ORDER — ATORVASTATIN CALCIUM 10 MG PO TABS: 10.0000 mg | ORAL_TABLET | Freq: Every day | ORAL | 3 refills | Status: DC

## 2018-05-25 NOTE — Progress Notes (Signed)
HPI Mr. Boldman returns today for ongoing follow-up of his paroxysmal atrial fibrillation.  He is a 73 year old man who has a history of atrial fibrillation dating back 16 years.  He has been well controlled with medical therapy using flecainide.  Over the past 2-3 years, he has had no sustained episodes of atrial fibrillation and has not required hospitalization.  He had hypertension and was taking toprol but his pressure has gone down, and he has been off toprol and his bp has been on the low side.  The patient was diagnosed with oral cancer and has undergone extensive treatment with radiation and chemotherapy.  He was initially taking a PEG tube but now is not. He is able to swallow. No Known Allergies   Current Outpatient Medications  Medication Sig Dispense Refill  . allopurinol (ZYLOPRIM) 100 MG tablet Take 100 mg by mouth daily.  0  . ALPRAZolam (XANAX) 0.5 MG tablet Take 0.25 mg by mouth at bedtime as needed for sleep.    Marland Kitchen apixaban (ELIQUIS) 5 MG TABS tablet Take 1 tablet (5 mg total) by mouth 2 (two) times daily. 60 tablet 11  . ARIPiprazole (ABILIFY PO) Take 1 tablet by mouth daily. PATIENT CAN' T REMEMBER DOSAGE    . atorvastatin (LIPITOR) 10 MG tablet Take 10 mg by mouth daily.      Marland Kitchen escitalopram (LEXAPRO) 20 MG tablet Take 20 mg by mouth daily.    . flecainide (TAMBOCOR) 150 MG tablet Take 1 tablet (150 mg total) by mouth 2 (two) times daily. 180 tablet 0  . metoprolol tartrate (LOPRESSOR) 25 MG tablet Take 1 tablet (25 mg total) by mouth 2 (two) times daily. 180 tablet 2  . zolpidem (AMBIEN CR) 12.5 MG CR tablet Take 12.5 mg by mouth daily.   0   No current facility-administered medications for this visit.      Past Medical History:  Diagnosis Date  . HTN (hypertension)   . PAF (paroxysmal atrial fibrillation) (HCC)     ROS:   All systems reviewed and negative except as noted in the HPI.   History reviewed. No pertinent surgical history.   Family History    Problem Relation Age of Onset  . Atrial fibrillation Mother   . Atrial fibrillation Father      Social History   Socioeconomic History  . Marital status: Married    Spouse name: Not on file  . Number of children: Not on file  . Years of education: Not on file  . Highest education level: Not on file  Occupational History  . Not on file  Social Needs  . Financial resource strain: Not on file  . Food insecurity:    Worry: Not on file    Inability: Not on file  . Transportation needs:    Medical: Not on file    Non-medical: Not on file  Tobacco Use  . Smoking status: Never Smoker  . Smokeless tobacco: Never Used  Substance and Sexual Activity  . Alcohol use: Not on file  . Drug use: Not on file  . Sexual activity: Not on file  Lifestyle  . Physical activity:    Days per week: Not on file    Minutes per session: Not on file  . Stress: Not on file  Relationships  . Social connections:    Talks on phone: Not on file    Gets together: Not on file    Attends religious service: Not on file  Active member of club or organization: Not on file    Attends meetings of clubs or organizations: Not on file    Relationship status: Not on file  . Intimate partner violence:    Fear of current or ex partner: Not on file    Emotionally abused: Not on file    Physically abused: Not on file    Forced sexual activity: Not on file  Other Topics Concern  . Not on file  Social History Narrative  . Not on file     BP 116/66   Pulse 85   Ht 6\' 2"  (1.88 m)   Wt 206 lb 3.2 oz (93.5 kg)   SpO2 98%   BMI 26.47 kg/m   Physical Exam:  Well appearing NAD HEENT: Unremarkable Neck:  6 cm JVD, no thyromegally Lymphatics:  No adenopathy Back:  No CVA tenderness Lungs:  Clear with no wheezes HEART:  Regular rate rhythm, no murmurs, no rubs, no clicks Abd:  soft, positive bowel sounds, no organomegally, no rebound, no guarding Ext:  2 plus pulses, no edema, no cyanosis, no  clubbing Skin:  No rashes no nodules Neuro:  CN II through XII intact, motor grossly intact  EKG NSR with first degree AV block   Assess/Plan: 1. PAF - he is maintaining NSR very nicely. 2. HTN - his bp has normalized. He will continue watchful waiting and remain off of his beta blocker 3. Coags - he will continue eliquis.  Mikle Bosworth.D.

## 2018-05-25 NOTE — Patient Instructions (Signed)

## 2018-06-23 DIAGNOSIS — Z85818 Personal history of malignant neoplasm of other sites of lip, oral cavity, and pharynx: Secondary | ICD-10-CM | POA: Diagnosis not present

## 2018-06-23 DIAGNOSIS — C139 Malignant neoplasm of hypopharynx, unspecified: Secondary | ICD-10-CM | POA: Diagnosis not present

## 2018-06-23 DIAGNOSIS — C138 Malignant neoplasm of overlapping sites of hypopharynx: Secondary | ICD-10-CM | POA: Diagnosis not present

## 2018-06-23 DIAGNOSIS — K222 Esophageal obstruction: Secondary | ICD-10-CM | POA: Diagnosis not present

## 2018-07-13 IMAGING — US US BIOPSY
1 series · 11 of 11 positions shown · non-contrast
Comparison: Contrast-enhanced neck CT - 07/19/2016

INDICATION: Concern for head and neck cancer with metastatic lymphadenopathy.
Please perform ultrasound-guided right cervical lymph node biopsy
for tissue diagnostic purposes.

EXAM:
ULTRASOUND GUIDED RIGHT CERVICAL LYMPH NODE BIOPSY
TECHNIQUE: Informed written consent was obtained from the patient after a
discussion of the risks, benefits and alternatives to treatment.
Questions regarding the procedure were encouraged and answered.
Initial ultrasound scanning demonstrated multiple mixed echogenic
pathologically enlarged cervical lymph nodes. Dominant approximately
1.6 x 2.0 cm cervical lymph node (image 7) correlating with the
patient's palpable area of concern on preceding contrast-enhanced
neck CT was targeted for biopsy given lymph node location and
sonographic window.. An ultrasound image was saved for documentation
purposes. The procedure was planned. A timeout was performed prior
to the initiation of the procedure.

[Series 1: us biopsy · 0.05mm/px · 11 of 11 slices shown]
[im 1/11]
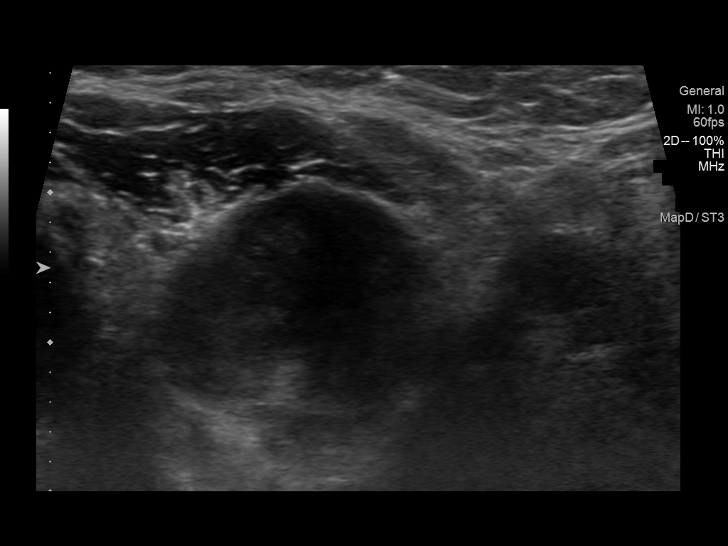
[im 2/11]
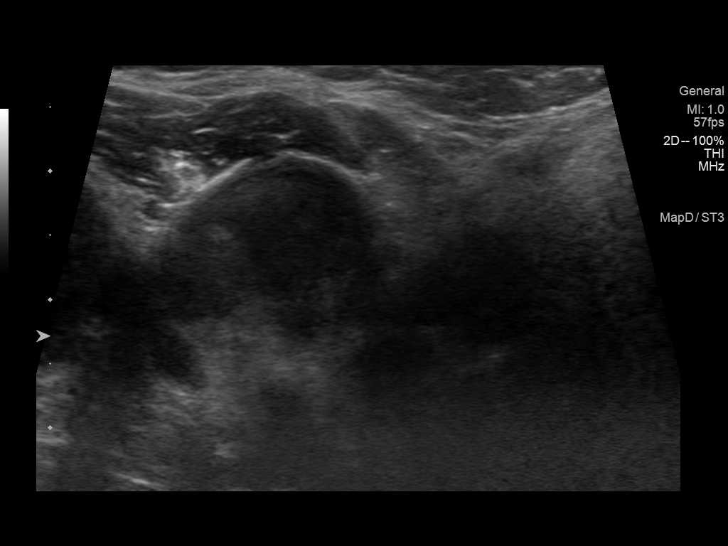
[im 3/11]
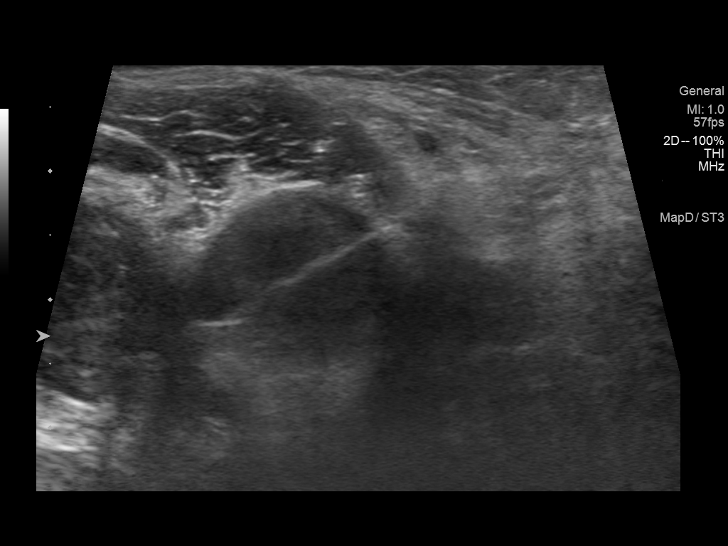
[im 4/11]
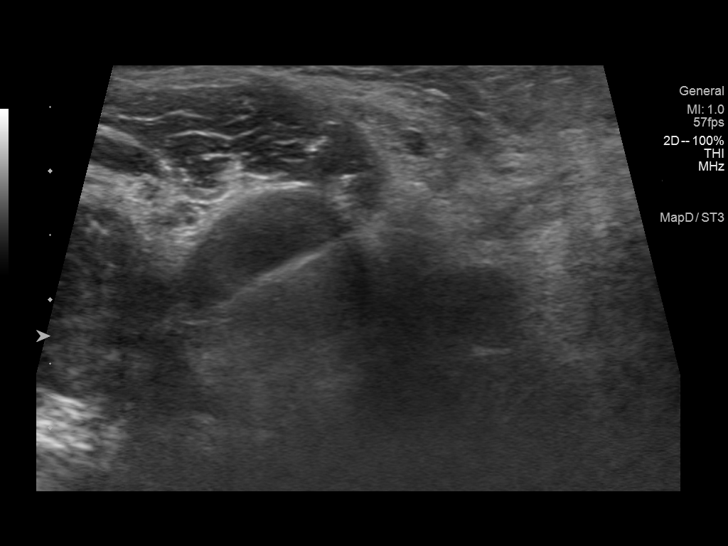
[im 5/11]
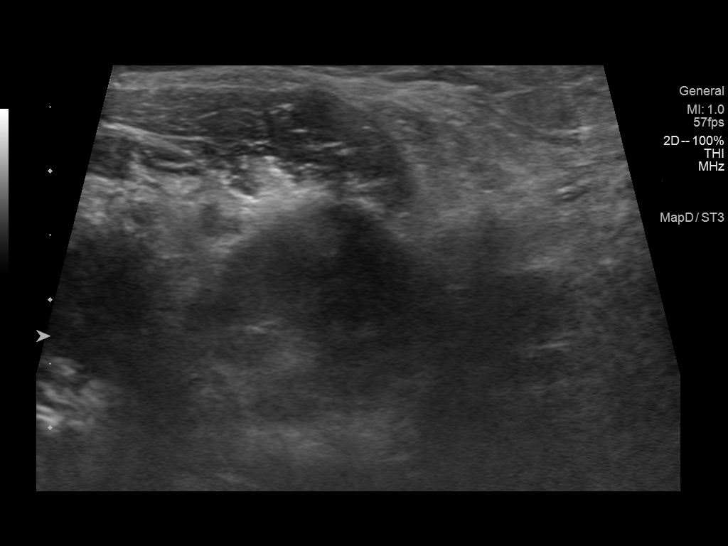
[im 6/11]
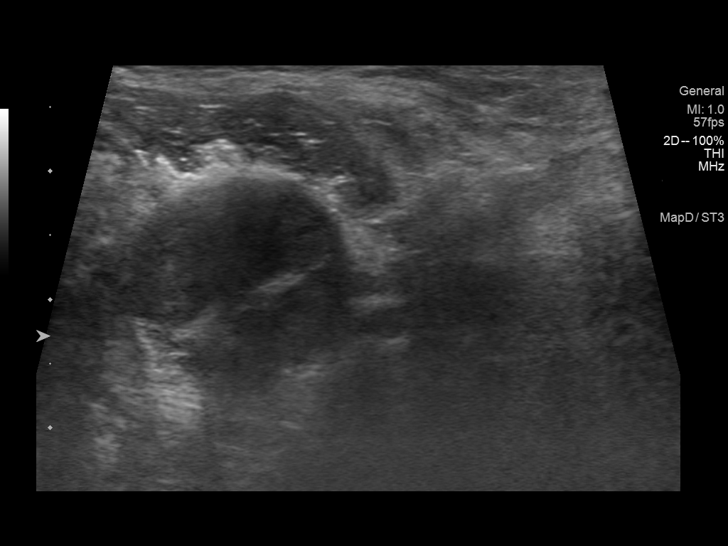
[im 7/11]
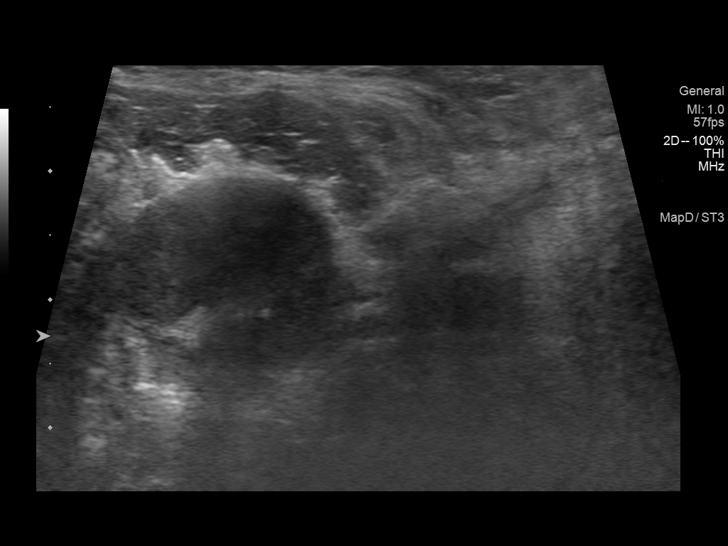
[im 8/11]
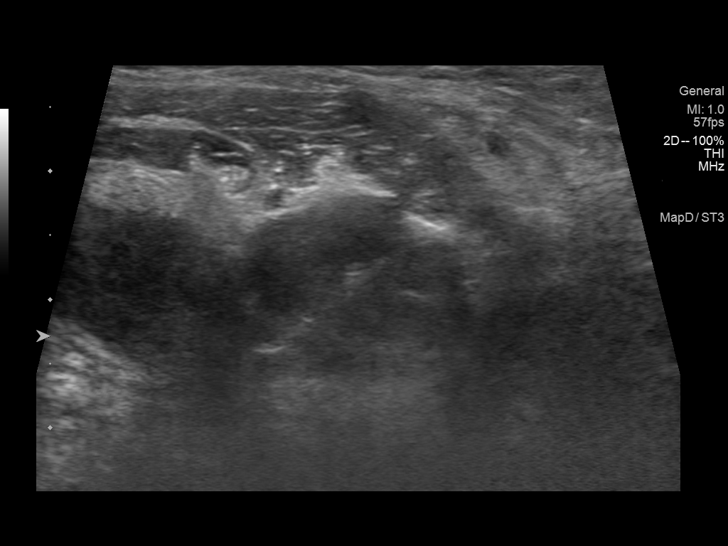
[im 9/11]
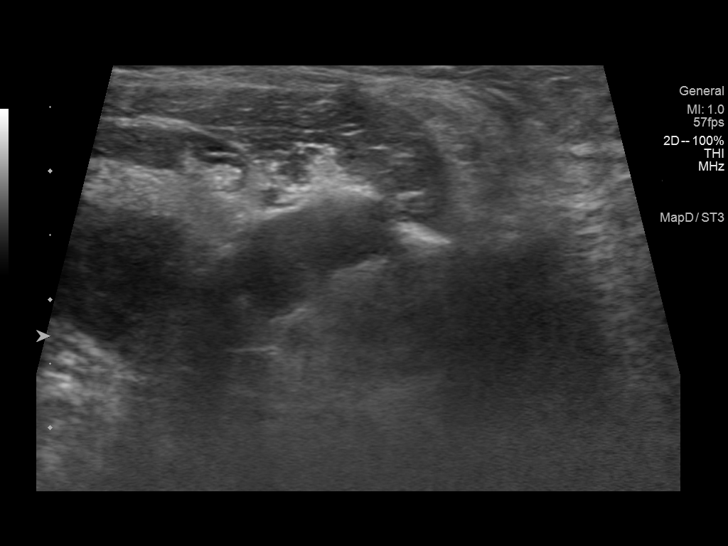
[im 10/11]
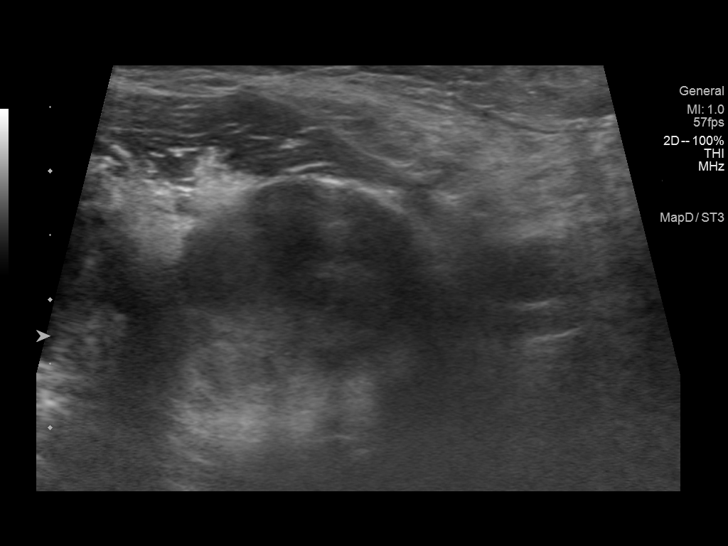
[im 11/11]
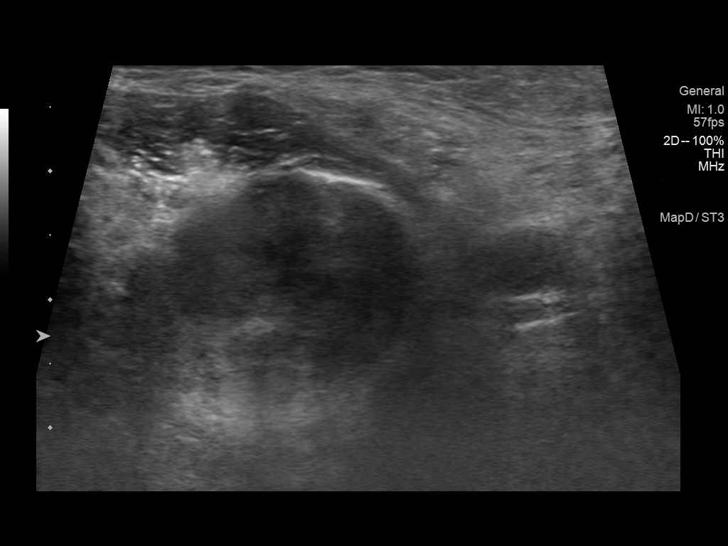

[11 of 11 positions shown; findings below may reference images not displayed]

MEDICATIONS:
None

ANESTHESIA/SEDATION:
Moderate (conscious) sedation was employed during this procedure. A
total of Versed 1 mg and Fentanyl 50 mcg was administered
intravenously.

Moderate Sedation Time: 10 minutes. The patient's level of
consciousness and vital signs were monitored continuously by
radiology nursing throughout the procedure under my direct
supervision.

COMPLICATIONS:
None immediate.
The operative was prepped and draped in the usual sterile fashion,
and a sterile drape was applied covering the operative field. A
timeout was performed prior to the initiation of the procedure.
Local anesthesia was provided with 1% lidocaine with epinephrine.

Under direct ultrasound guidance, an 18 gauge core needle device was
utilized to obtain to obtain 4 core needle biopsies of the right
cervical lymph node.

The samples were placed in saline and submitted to pathology. The
needle was removed and hemostasis was achieved with manual
compression. Post procedure scan was negative for significant
hematoma. A dressing was placed. The patient tolerated the procedure
well without immediate postprocedural complication.
IMPRESSION: Technically successful ultrasound guided biopsy of dominant right
cervical lymph node.

## 2018-07-20 DIAGNOSIS — N183 Chronic kidney disease, stage 3 (moderate): Secondary | ICD-10-CM | POA: Diagnosis not present

## 2018-07-20 DIAGNOSIS — R7302 Impaired glucose tolerance (oral): Secondary | ICD-10-CM | POA: Diagnosis not present

## 2018-07-20 DIAGNOSIS — Z931 Gastrostomy status: Secondary | ICD-10-CM | POA: Diagnosis not present

## 2018-07-20 DIAGNOSIS — I10 Essential (primary) hypertension: Secondary | ICD-10-CM | POA: Diagnosis not present

## 2018-08-06 DIAGNOSIS — I4891 Unspecified atrial fibrillation: Secondary | ICD-10-CM | POA: Diagnosis not present

## 2018-08-06 DIAGNOSIS — R197 Diarrhea, unspecified: Secondary | ICD-10-CM | POA: Diagnosis not present

## 2018-08-06 DIAGNOSIS — C138 Malignant neoplasm of overlapping sites of hypopharynx: Secondary | ICD-10-CM | POA: Diagnosis not present

## 2018-10-20 DIAGNOSIS — C138 Malignant neoplasm of overlapping sites of hypopharynx: Secondary | ICD-10-CM | POA: Diagnosis not present

## 2018-10-20 DIAGNOSIS — C770 Secondary and unspecified malignant neoplasm of lymph nodes of head, face and neck: Secondary | ICD-10-CM | POA: Diagnosis not present

## 2018-11-05 DIAGNOSIS — R197 Diarrhea, unspecified: Secondary | ICD-10-CM | POA: Diagnosis not present

## 2018-11-05 DIAGNOSIS — C138 Malignant neoplasm of overlapping sites of hypopharynx: Secondary | ICD-10-CM | POA: Diagnosis not present

## 2018-11-05 DIAGNOSIS — I4891 Unspecified atrial fibrillation: Secondary | ICD-10-CM | POA: Diagnosis not present

## 2019-02-26 DIAGNOSIS — Z0000 Encounter for general adult medical examination without abnormal findings: Secondary | ICD-10-CM | POA: Diagnosis not present

## 2019-02-26 DIAGNOSIS — C138 Malignant neoplasm of overlapping sites of hypopharynx: Secondary | ICD-10-CM | POA: Diagnosis not present

## 2019-02-26 DIAGNOSIS — C139 Malignant neoplasm of hypopharynx, unspecified: Secondary | ICD-10-CM | POA: Diagnosis not present

## 2019-04-28 DIAGNOSIS — C138 Malignant neoplasm of overlapping sites of hypopharynx: Secondary | ICD-10-CM | POA: Diagnosis not present

## 2019-04-28 DIAGNOSIS — K222 Esophageal obstruction: Secondary | ICD-10-CM | POA: Diagnosis not present

## 2019-04-28 DIAGNOSIS — R197 Diarrhea, unspecified: Secondary | ICD-10-CM | POA: Diagnosis not present

## 2019-04-28 DIAGNOSIS — I4891 Unspecified atrial fibrillation: Secondary | ICD-10-CM | POA: Diagnosis not present

## 2019-06-07 DIAGNOSIS — Z23 Encounter for immunization: Secondary | ICD-10-CM | POA: Diagnosis not present

## 2019-06-07 DIAGNOSIS — Z0000 Encounter for general adult medical examination without abnormal findings: Secondary | ICD-10-CM | POA: Diagnosis not present

## 2019-06-07 DIAGNOSIS — I4891 Unspecified atrial fibrillation: Secondary | ICD-10-CM | POA: Diagnosis not present

## 2019-06-07 DIAGNOSIS — Z125 Encounter for screening for malignant neoplasm of prostate: Secondary | ICD-10-CM | POA: Diagnosis not present

## 2019-06-10 ENCOUNTER — Telehealth: Payer: Self-pay | Admitting: Internal Medicine

## 2019-06-10 DIAGNOSIS — I480 Paroxysmal atrial fibrillation: Secondary | ICD-10-CM

## 2019-06-10 NOTE — Telephone Encounter (Signed)
Patient would like Dr. Lovena Le to send a referral to any of the Cardiologists at the [facility] in [place]. He had been making the trip to see Dr. Lovena Le for a while and would visit with friends, but now due to [place] and visitor restrictions he is not traveling and would just like to establish care in [place]. The patient was under the impression Dr. Lovena Le had a specific Doctor in mind already  If Dr. Lovena Le would need to contact him , the patient says the best way to reach him would be via e-mail [email or web address]  .

## 2019-06-23 DIAGNOSIS — I10 Essential (primary) hypertension: Secondary | ICD-10-CM | POA: Diagnosis not present

## 2019-06-23 DIAGNOSIS — I480 Paroxysmal atrial fibrillation: Secondary | ICD-10-CM | POA: Diagnosis not present

## 2019-06-30 ENCOUNTER — Other Ambulatory Visit: Payer: Self-pay | Admitting: Internal Medicine

## 2019-07-02 DIAGNOSIS — R1310 Dysphagia, unspecified: Secondary | ICD-10-CM | POA: Diagnosis not present

## 2019-07-02 DIAGNOSIS — C138 Malignant neoplasm of overlapping sites of hypopharynx: Secondary | ICD-10-CM | POA: Diagnosis not present

## 2019-07-02 DIAGNOSIS — R070 Pain in throat: Secondary | ICD-10-CM | POA: Diagnosis not present

## 2019-07-02 DIAGNOSIS — K222 Esophageal obstruction: Secondary | ICD-10-CM | POA: Diagnosis not present

## 2019-07-02 DIAGNOSIS — C770 Secondary and unspecified malignant neoplasm of lymph nodes of head, face and neck: Secondary | ICD-10-CM | POA: Diagnosis not present

## 2019-07-07 DIAGNOSIS — H6123 Impacted cerumen, bilateral: Secondary | ICD-10-CM | POA: Diagnosis not present

## 2019-07-07 DIAGNOSIS — H9313 Tinnitus, bilateral: Secondary | ICD-10-CM | POA: Diagnosis not present

## 2019-07-07 DIAGNOSIS — H903 Sensorineural hearing loss, bilateral: Secondary | ICD-10-CM | POA: Diagnosis not present

## 2019-07-16 DIAGNOSIS — Z20822 Contact with and (suspected) exposure to covid-19: Secondary | ICD-10-CM | POA: Diagnosis not present

## 2019-07-16 DIAGNOSIS — Z01812 Encounter for preprocedural laboratory examination: Secondary | ICD-10-CM | POA: Diagnosis not present

## 2019-07-21 DIAGNOSIS — M109 Gout, unspecified: Secondary | ICD-10-CM | POA: Diagnosis not present

## 2019-07-21 DIAGNOSIS — C139 Malignant neoplasm of hypopharynx, unspecified: Secondary | ICD-10-CM | POA: Diagnosis not present

## 2019-07-21 DIAGNOSIS — C329 Malignant neoplasm of larynx, unspecified: Secondary | ICD-10-CM | POA: Diagnosis not present

## 2019-07-21 DIAGNOSIS — C770 Secondary and unspecified malignant neoplasm of lymph nodes of head, face and neck: Secondary | ICD-10-CM | POA: Diagnosis not present

## 2019-07-21 DIAGNOSIS — Z7901 Long term (current) use of anticoagulants: Secondary | ICD-10-CM | POA: Diagnosis not present

## 2019-07-21 DIAGNOSIS — Z79899 Other long term (current) drug therapy: Secondary | ICD-10-CM | POA: Diagnosis not present

## 2019-07-21 DIAGNOSIS — I480 Paroxysmal atrial fibrillation: Secondary | ICD-10-CM | POA: Diagnosis not present

## 2019-07-21 DIAGNOSIS — K222 Esophageal obstruction: Secondary | ICD-10-CM | POA: Diagnosis not present

## 2019-08-05 DIAGNOSIS — I480 Paroxysmal atrial fibrillation: Secondary | ICD-10-CM | POA: Diagnosis not present

## 2019-08-05 DIAGNOSIS — R197 Diarrhea, unspecified: Secondary | ICD-10-CM | POA: Diagnosis not present

## 2019-08-05 DIAGNOSIS — C138 Malignant neoplasm of overlapping sites of hypopharynx: Secondary | ICD-10-CM | POA: Diagnosis not present

## 2019-08-11 DIAGNOSIS — I4891 Unspecified atrial fibrillation: Secondary | ICD-10-CM | POA: Diagnosis not present

## 2019-08-11 DIAGNOSIS — I480 Paroxysmal atrial fibrillation: Secondary | ICD-10-CM | POA: Diagnosis not present

## 2019-09-28 ENCOUNTER — Other Ambulatory Visit: Payer: Self-pay | Admitting: Internal Medicine

## 2019-09-30 DIAGNOSIS — R197 Diarrhea, unspecified: Secondary | ICD-10-CM | POA: Diagnosis not present

## 2019-09-30 DIAGNOSIS — Z20822 Contact with and (suspected) exposure to covid-19: Secondary | ICD-10-CM | POA: Diagnosis not present

## 2019-10-04 DIAGNOSIS — Z1211 Encounter for screening for malignant neoplasm of colon: Secondary | ICD-10-CM | POA: Diagnosis not present

## 2019-10-04 DIAGNOSIS — K5730 Diverticulosis of large intestine without perforation or abscess without bleeding: Secondary | ICD-10-CM | POA: Diagnosis not present

## 2019-10-04 DIAGNOSIS — K644 Residual hemorrhoidal skin tags: Secondary | ICD-10-CM | POA: Diagnosis not present

## 2019-10-04 DIAGNOSIS — D125 Benign neoplasm of sigmoid colon: Secondary | ICD-10-CM | POA: Diagnosis not present

## 2019-10-04 DIAGNOSIS — I4891 Unspecified atrial fibrillation: Secondary | ICD-10-CM | POA: Diagnosis not present

## 2019-10-04 DIAGNOSIS — D123 Benign neoplasm of transverse colon: Secondary | ICD-10-CM | POA: Diagnosis not present

## 2019-10-04 DIAGNOSIS — Z87891 Personal history of nicotine dependence: Secondary | ICD-10-CM | POA: Diagnosis not present

## 2019-10-29 DIAGNOSIS — C329 Malignant neoplasm of larynx, unspecified: Secondary | ICD-10-CM | POA: Diagnosis not present

## 2019-10-29 DIAGNOSIS — R931 Abnormal findings on diagnostic imaging of heart and coronary circulation: Secondary | ICD-10-CM | POA: Diagnosis not present

## 2020-02-02 DIAGNOSIS — R197 Diarrhea, unspecified: Secondary | ICD-10-CM | POA: Diagnosis not present

## 2020-02-02 DIAGNOSIS — Z86010 Personal history of colonic polyps: Secondary | ICD-10-CM | POA: Diagnosis not present

## 2020-02-02 DIAGNOSIS — C138 Malignant neoplasm of overlapping sites of hypopharynx: Secondary | ICD-10-CM | POA: Diagnosis not present

## 2020-02-02 DIAGNOSIS — I480 Paroxysmal atrial fibrillation: Secondary | ICD-10-CM | POA: Diagnosis not present

## 2020-04-28 DIAGNOSIS — C139 Malignant neoplasm of hypopharynx, unspecified: Secondary | ICD-10-CM | POA: Diagnosis not present

## 2020-04-28 DIAGNOSIS — C138 Malignant neoplasm of overlapping sites of hypopharynx: Secondary | ICD-10-CM | POA: Diagnosis not present

## 2020-05-25 DIAGNOSIS — H25811 Combined forms of age-related cataract, right eye: Secondary | ICD-10-CM | POA: Diagnosis not present

## 2020-05-25 DIAGNOSIS — H5000 Unspecified esotropia: Secondary | ICD-10-CM | POA: Diagnosis not present

## 2020-07-21 DIAGNOSIS — H5005 Alternating esotropia: Secondary | ICD-10-CM | POA: Diagnosis not present

## 2020-07-25 DIAGNOSIS — M109 Gout, unspecified: Secondary | ICD-10-CM | POA: Diagnosis not present

## 2020-07-25 DIAGNOSIS — I480 Paroxysmal atrial fibrillation: Secondary | ICD-10-CM | POA: Diagnosis not present

## 2020-07-25 DIAGNOSIS — C138 Malignant neoplasm of overlapping sites of hypopharynx: Secondary | ICD-10-CM | POA: Diagnosis not present

## 2020-07-25 DIAGNOSIS — R197 Diarrhea, unspecified: Secondary | ICD-10-CM | POA: Diagnosis not present

## 2020-08-18 DIAGNOSIS — H524 Presbyopia: Secondary | ICD-10-CM | POA: Diagnosis not present

## 2020-08-18 DIAGNOSIS — H5005 Alternating esotropia: Secondary | ICD-10-CM | POA: Diagnosis not present

## 2020-09-11 DIAGNOSIS — Z0000 Encounter for general adult medical examination without abnormal findings: Secondary | ICD-10-CM | POA: Diagnosis not present

## 2020-09-11 DIAGNOSIS — I482 Chronic atrial fibrillation, unspecified: Secondary | ICD-10-CM | POA: Diagnosis not present

## 2020-09-11 DIAGNOSIS — Z7901 Long term (current) use of anticoagulants: Secondary | ICD-10-CM | POA: Diagnosis not present

## 2020-09-11 DIAGNOSIS — L570 Actinic keratosis: Secondary | ICD-10-CM | POA: Diagnosis not present

## 2020-09-11 DIAGNOSIS — E785 Hyperlipidemia, unspecified: Secondary | ICD-10-CM | POA: Diagnosis not present

## 2020-09-11 DIAGNOSIS — I480 Paroxysmal atrial fibrillation: Secondary | ICD-10-CM | POA: Diagnosis not present

## 2020-09-11 DIAGNOSIS — Z125 Encounter for screening for malignant neoplasm of prostate: Secondary | ICD-10-CM | POA: Diagnosis not present

## 2020-10-24 DIAGNOSIS — Z79899 Other long term (current) drug therapy: Secondary | ICD-10-CM | POA: Diagnosis not present

## 2020-10-24 DIAGNOSIS — I440 Atrioventricular block, first degree: Secondary | ICD-10-CM | POA: Diagnosis not present

## 2020-10-24 DIAGNOSIS — I4891 Unspecified atrial fibrillation: Secondary | ICD-10-CM | POA: Diagnosis not present

## 2020-10-24 DIAGNOSIS — I480 Paroxysmal atrial fibrillation: Secondary | ICD-10-CM | POA: Diagnosis not present

## 2020-11-10 DIAGNOSIS — C138 Malignant neoplasm of overlapping sites of hypopharynx: Secondary | ICD-10-CM | POA: Diagnosis not present

## 2020-11-10 DIAGNOSIS — R42 Dizziness and giddiness: Secondary | ICD-10-CM | POA: Diagnosis not present

## 2020-11-10 DIAGNOSIS — R5382 Chronic fatigue, unspecified: Secondary | ICD-10-CM | POA: Diagnosis not present

## 2021-01-29 DIAGNOSIS — R55 Syncope and collapse: Secondary | ICD-10-CM | POA: Diagnosis not present

## 2021-01-29 DIAGNOSIS — I4891 Unspecified atrial fibrillation: Secondary | ICD-10-CM | POA: Diagnosis not present

## 2021-01-29 DIAGNOSIS — R251 Tremor, unspecified: Secondary | ICD-10-CM | POA: Diagnosis not present

## 2021-01-29 DIAGNOSIS — G629 Polyneuropathy, unspecified: Secondary | ICD-10-CM | POA: Diagnosis not present

## 2021-01-31 DIAGNOSIS — I493 Ventricular premature depolarization: Secondary | ICD-10-CM | POA: Diagnosis not present

## 2021-01-31 DIAGNOSIS — I471 Supraventricular tachycardia: Secondary | ICD-10-CM | POA: Diagnosis not present

## 2021-01-31 DIAGNOSIS — I440 Atrioventricular block, first degree: Secondary | ICD-10-CM | POA: Diagnosis not present

## 2021-02-02 DIAGNOSIS — L578 Other skin changes due to chronic exposure to nonionizing radiation: Secondary | ICD-10-CM | POA: Diagnosis not present

## 2021-02-02 DIAGNOSIS — C44519 Basal cell carcinoma of skin of other part of trunk: Secondary | ICD-10-CM | POA: Diagnosis not present

## 2021-02-02 DIAGNOSIS — L812 Freckles: Secondary | ICD-10-CM | POA: Diagnosis not present

## 2021-02-02 DIAGNOSIS — L570 Actinic keratosis: Secondary | ICD-10-CM | POA: Diagnosis not present

## 2021-02-05 DIAGNOSIS — E538 Deficiency of other specified B group vitamins: Secondary | ICD-10-CM | POA: Diagnosis not present

## 2021-02-07 DIAGNOSIS — R55 Syncope and collapse: Secondary | ICD-10-CM | POA: Diagnosis not present

## 2021-02-12 DIAGNOSIS — E538 Deficiency of other specified B group vitamins: Secondary | ICD-10-CM | POA: Diagnosis not present

## 2021-02-14 DIAGNOSIS — R269 Unspecified abnormalities of gait and mobility: Secondary | ICD-10-CM | POA: Diagnosis not present

## 2021-02-14 DIAGNOSIS — R55 Syncope and collapse: Secondary | ICD-10-CM | POA: Diagnosis not present

## 2021-02-19 DIAGNOSIS — E538 Deficiency of other specified B group vitamins: Secondary | ICD-10-CM | POA: Diagnosis not present

## 2021-02-26 DIAGNOSIS — E538 Deficiency of other specified B group vitamins: Secondary | ICD-10-CM | POA: Diagnosis not present

## 2021-02-27 DIAGNOSIS — C44519 Basal cell carcinoma of skin of other part of trunk: Secondary | ICD-10-CM | POA: Diagnosis not present

## 2021-02-27 DIAGNOSIS — R251 Tremor, unspecified: Secondary | ICD-10-CM | POA: Diagnosis not present

## 2021-02-27 DIAGNOSIS — R55 Syncope and collapse: Secondary | ICD-10-CM | POA: Diagnosis not present

## 2021-02-27 DIAGNOSIS — Z7901 Long term (current) use of anticoagulants: Secondary | ICD-10-CM | POA: Diagnosis not present

## 2021-02-27 DIAGNOSIS — L7621 Postprocedural hemorrhage and hematoma of skin and subcutaneous tissue following a dermatologic procedure: Secondary | ICD-10-CM | POA: Diagnosis not present

## 2021-02-27 DIAGNOSIS — R569 Unspecified convulsions: Secondary | ICD-10-CM | POA: Diagnosis not present

## 2021-02-27 DIAGNOSIS — I4891 Unspecified atrial fibrillation: Secondary | ICD-10-CM | POA: Diagnosis not present

## 2021-02-27 DIAGNOSIS — S21101A Unspecified open wound of right front wall of thorax without penetration into thoracic cavity, initial encounter: Secondary | ICD-10-CM | POA: Diagnosis not present

## 2021-02-27 DIAGNOSIS — Z87891 Personal history of nicotine dependence: Secondary | ICD-10-CM | POA: Diagnosis not present

## 2021-03-03 DIAGNOSIS — I493 Ventricular premature depolarization: Secondary | ICD-10-CM | POA: Diagnosis not present

## 2021-03-03 DIAGNOSIS — I471 Supraventricular tachycardia: Secondary | ICD-10-CM | POA: Diagnosis not present

## 2021-03-03 DIAGNOSIS — I440 Atrioventricular block, first degree: Secondary | ICD-10-CM | POA: Diagnosis not present

## 2021-03-13 DIAGNOSIS — T8140XA Infection following a procedure, unspecified, initial encounter: Secondary | ICD-10-CM | POA: Diagnosis not present

## 2021-03-15 DIAGNOSIS — G629 Polyneuropathy, unspecified: Secondary | ICD-10-CM | POA: Diagnosis not present

## 2021-03-15 DIAGNOSIS — E538 Deficiency of other specified B group vitamins: Secondary | ICD-10-CM | POA: Diagnosis not present

## 2021-03-15 DIAGNOSIS — G250 Essential tremor: Secondary | ICD-10-CM | POA: Diagnosis not present

## 2021-03-15 DIAGNOSIS — R55 Syncope and collapse: Secondary | ICD-10-CM | POA: Diagnosis not present

## 2021-03-18 DIAGNOSIS — S299XXA Unspecified injury of thorax, initial encounter: Secondary | ICD-10-CM | POA: Diagnosis not present

## 2021-03-18 DIAGNOSIS — I454 Nonspecific intraventricular block: Secondary | ICD-10-CM | POA: Diagnosis not present

## 2021-03-18 DIAGNOSIS — I498 Other specified cardiac arrhythmias: Secondary | ICD-10-CM | POA: Diagnosis not present

## 2021-03-18 DIAGNOSIS — S0990XA Unspecified injury of head, initial encounter: Secondary | ICD-10-CM | POA: Diagnosis not present

## 2021-03-18 DIAGNOSIS — R55 Syncope and collapse: Secondary | ICD-10-CM | POA: Diagnosis not present

## 2021-03-18 DIAGNOSIS — J984 Other disorders of lung: Secondary | ICD-10-CM | POA: Diagnosis not present

## 2021-03-18 DIAGNOSIS — S3993XA Unspecified injury of pelvis, initial encounter: Secondary | ICD-10-CM | POA: Diagnosis not present

## 2021-03-18 DIAGNOSIS — Z043 Encounter for examination and observation following other accident: Secondary | ICD-10-CM | POA: Diagnosis not present

## 2021-03-18 DIAGNOSIS — R58 Hemorrhage, not elsewhere classified: Secondary | ICD-10-CM | POA: Diagnosis not present

## 2021-03-18 DIAGNOSIS — S3991XA Unspecified injury of abdomen, initial encounter: Secondary | ICD-10-CM | POA: Diagnosis not present

## 2021-03-18 DIAGNOSIS — S199XXA Unspecified injury of neck, initial encounter: Secondary | ICD-10-CM | POA: Diagnosis not present

## 2021-03-18 DIAGNOSIS — M545 Low back pain, unspecified: Secondary | ICD-10-CM | POA: Diagnosis not present

## 2021-03-19 DIAGNOSIS — F5101 Primary insomnia: Secondary | ICD-10-CM | POA: Diagnosis not present

## 2021-03-19 DIAGNOSIS — R296 Repeated falls: Secondary | ICD-10-CM | POA: Diagnosis not present

## 2021-03-19 DIAGNOSIS — Z7409 Other reduced mobility: Secondary | ICD-10-CM | POA: Diagnosis not present

## 2021-03-19 DIAGNOSIS — G8911 Acute pain due to trauma: Secondary | ICD-10-CM | POA: Diagnosis not present

## 2021-03-19 DIAGNOSIS — T1490XA Injury, unspecified, initial encounter: Secondary | ICD-10-CM | POA: Diagnosis not present

## 2021-03-19 DIAGNOSIS — I6523 Occlusion and stenosis of bilateral carotid arteries: Secondary | ICD-10-CM | POA: Diagnosis not present

## 2021-03-20 DIAGNOSIS — R296 Repeated falls: Secondary | ICD-10-CM | POA: Diagnosis not present

## 2021-03-20 DIAGNOSIS — T1490XA Injury, unspecified, initial encounter: Secondary | ICD-10-CM | POA: Diagnosis not present

## 2021-03-20 DIAGNOSIS — R1310 Dysphagia, unspecified: Secondary | ICD-10-CM | POA: Diagnosis not present

## 2021-03-20 DIAGNOSIS — G8911 Acute pain due to trauma: Secondary | ICD-10-CM | POA: Diagnosis not present

## 2021-03-20 DIAGNOSIS — F1010 Alcohol abuse, uncomplicated: Secondary | ICD-10-CM | POA: Diagnosis not present

## 2021-03-20 DIAGNOSIS — J984 Other disorders of lung: Secondary | ICD-10-CM | POA: Diagnosis not present

## 2021-03-20 DIAGNOSIS — Z7409 Other reduced mobility: Secondary | ICD-10-CM | POA: Diagnosis not present

## 2021-03-20 DIAGNOSIS — F5101 Primary insomnia: Secondary | ICD-10-CM | POA: Diagnosis not present

## 2021-03-20 DIAGNOSIS — S2243XA Multiple fractures of ribs, bilateral, initial encounter for closed fracture: Secondary | ICD-10-CM | POA: Diagnosis not present

## 2021-03-20 DIAGNOSIS — R918 Other nonspecific abnormal finding of lung field: Secondary | ICD-10-CM | POA: Diagnosis not present

## 2021-03-21 DIAGNOSIS — R55 Syncope and collapse: Secondary | ICD-10-CM | POA: Diagnosis not present

## 2021-03-21 DIAGNOSIS — I480 Paroxysmal atrial fibrillation: Secondary | ICD-10-CM | POA: Diagnosis not present

## 2021-03-21 DIAGNOSIS — T1490XA Injury, unspecified, initial encounter: Secondary | ICD-10-CM | POA: Diagnosis not present

## 2021-03-21 DIAGNOSIS — Z9221 Personal history of antineoplastic chemotherapy: Secondary | ICD-10-CM | POA: Diagnosis not present

## 2021-03-21 DIAGNOSIS — G3184 Mild cognitive impairment, so stated: Secondary | ICD-10-CM | POA: Diagnosis not present

## 2021-03-21 DIAGNOSIS — J1100 Influenza due to unidentified influenza virus with unspecified type of pneumonia: Secondary | ICD-10-CM | POA: Diagnosis not present

## 2021-03-22 DIAGNOSIS — R55 Syncope and collapse: Secondary | ICD-10-CM | POA: Diagnosis not present

## 2021-03-22 DIAGNOSIS — S2249XA Multiple fractures of ribs, unspecified side, initial encounter for closed fracture: Secondary | ICD-10-CM | POA: Diagnosis not present

## 2021-03-22 DIAGNOSIS — Z789 Other specified health status: Secondary | ICD-10-CM | POA: Diagnosis not present

## 2021-03-22 DIAGNOSIS — J1100 Influenza due to unidentified influenza virus with unspecified type of pneumonia: Secondary | ICD-10-CM | POA: Diagnosis not present

## 2021-03-23 DIAGNOSIS — Z789 Other specified health status: Secondary | ICD-10-CM | POA: Diagnosis not present

## 2021-03-23 DIAGNOSIS — J1100 Influenza due to unidentified influenza virus with unspecified type of pneumonia: Secondary | ICD-10-CM | POA: Diagnosis not present

## 2021-03-23 DIAGNOSIS — R55 Syncope and collapse: Secondary | ICD-10-CM | POA: Diagnosis not present

## 2021-03-23 DIAGNOSIS — S2249XA Multiple fractures of ribs, unspecified side, initial encounter for closed fracture: Secondary | ICD-10-CM | POA: Diagnosis not present

## 2021-03-28 DIAGNOSIS — S0101XA Laceration without foreign body of scalp, initial encounter: Secondary | ICD-10-CM | POA: Diagnosis not present

## 2021-03-28 DIAGNOSIS — I959 Hypotension, unspecified: Secondary | ICD-10-CM | POA: Diagnosis not present

## 2021-03-28 DIAGNOSIS — R55 Syncope and collapse: Secondary | ICD-10-CM | POA: Diagnosis not present

## 2021-03-28 DIAGNOSIS — I4891 Unspecified atrial fibrillation: Secondary | ICD-10-CM | POA: Diagnosis not present

## 2021-04-18 DIAGNOSIS — I480 Paroxysmal atrial fibrillation: Secondary | ICD-10-CM | POA: Diagnosis not present
